# Patient Record
Sex: Female | Born: 1955 | Race: Black or African American | Hispanic: No | Marital: Married | Smoking: Former smoker
Health system: Southern US, Community
[De-identification: ages and names within clinical notes are randomized; demographics above are authoritative.]

## PROBLEM LIST (undated history)

## (undated) DIAGNOSIS — M542 Cervicalgia: Secondary | ICD-10-CM

## (undated) DIAGNOSIS — M25519 Pain in unspecified shoulder: Secondary | ICD-10-CM

## (undated) DIAGNOSIS — R87629 Unspecified abnormal cytological findings in specimens from vagina: Secondary | ICD-10-CM

## (undated) HISTORY — DX: Pain in unspecified shoulder: M25.519

## (undated) HISTORY — DX: Cervicalgia: M54.2

## (undated) HISTORY — DX: Unspecified abnormal cytological findings in specimens from vagina: R87.629

---

## 2011-03-09 ENCOUNTER — Emergency Department (HOSPITAL_BASED_OUTPATIENT_CLINIC_OR_DEPARTMENT_OTHER)
Admission: EM | Admit: 2011-03-09 | Discharge: 2011-03-09 | Disposition: A | Discharge status: Home / Self Care | Payer: Managed Care, Other (non HMO) | Attending: Emergency Medicine | Admitting: Emergency Medicine

## 2011-03-09 DIAGNOSIS — K5900 Constipation, unspecified: Secondary | ICD-10-CM | POA: Insufficient documentation

## 2011-03-09 DIAGNOSIS — K589 Irritable bowel syndrome without diarrhea: Secondary | ICD-10-CM | POA: Insufficient documentation

## 2013-10-18 LAB — HM COLONOSCOPY

## 2016-04-27 ENCOUNTER — Encounter (HOSPITAL_BASED_OUTPATIENT_CLINIC_OR_DEPARTMENT_OTHER): Payer: Self-pay | Admitting: *Deleted

## 2016-04-27 ENCOUNTER — Emergency Department (HOSPITAL_BASED_OUTPATIENT_CLINIC_OR_DEPARTMENT_OTHER)
Admission: EM | Admit: 2016-04-27 | Discharge: 2016-04-27 | Disposition: A | Discharge status: Home / Self Care | Payer: 59 | Attending: Emergency Medicine | Admitting: Emergency Medicine

## 2016-04-27 DIAGNOSIS — Y999 Unspecified external cause status: Secondary | ICD-10-CM | POA: Diagnosis not present

## 2016-04-27 DIAGNOSIS — Y929 Unspecified place or not applicable: Secondary | ICD-10-CM | POA: Diagnosis not present

## 2016-04-27 DIAGNOSIS — S61216A Laceration without foreign body of right little finger without damage to nail, initial encounter: Secondary | ICD-10-CM | POA: Diagnosis not present

## 2016-04-27 DIAGNOSIS — W268XXA Contact with other sharp object(s), not elsewhere classified, initial encounter: Secondary | ICD-10-CM | POA: Diagnosis not present

## 2016-04-27 DIAGNOSIS — IMO0002 Reserved for concepts with insufficient information to code with codable children: Secondary | ICD-10-CM

## 2016-04-27 DIAGNOSIS — S6991XA Unspecified injury of right wrist, hand and finger(s), initial encounter: Secondary | ICD-10-CM | POA: Diagnosis present

## 2016-04-27 DIAGNOSIS — Y9389 Activity, other specified: Secondary | ICD-10-CM | POA: Diagnosis not present

## 2016-04-27 NOTE — ED Provider Notes (Signed)
CSN: [id number]     Arrival date & time 04/27/16  0940 History   First MD Initiated Contact with Patient 04/27/16 1003     Chief Complaint  Patient presents with  . Finger Injury     (Consider location/radiation/quality/duration/timing/severity/associated sxs/prior Treatment) Patient is a 60 y.o. female presenting with skin laceration.  Laceration Location:  Finger Finger laceration location:  R little finger Length (cm):  2 Depth:  Cutaneous Quality: jagged   Bleeding: controlled   Time since incident:  14 hours Laceration mechanism:  Metal edge Pain details:    Severity:  No pain Foreign body present:  No foreign bodies Relieved by:  None tried Worsened by:  Nothing tried Ineffective treatments:  None tried Tetanus status:  Up to date   History reviewed. No pertinent past medical history. Past Surgical History  Procedure Laterality Date  . Cesarean section     No family history on file. Social History  Substance Use Topics  . Smoking status: Never Smoker   . Smokeless tobacco: Never Used  . Alcohol Use: Yes     Comment: red wine   OB History    No data available     Review of Systems  Skin: Positive for wound.  All other systems reviewed and are negative.     Allergies  Review of patient's allergies indicates no known allergies.  Home Medications   Prior to Admission medications   Not on File   BP 148/76 mmHg  Pulse 64  Temp(Src) 98.7 F (37.1 C) (Oral)  Resp 18  Ht 5\' 4"  (1.626 m)  Wt 145 lb (65.772 kg)  BMI 24.88 kg/m2  SpO2 100% Physical Exam  Constitutional: She appears well-developed and well-nourished.  HENT:  Head: Normocephalic and atraumatic.  Neck: Normal range of motion.  Cardiovascular: Normal rate and regular rhythm.   Pulmonary/Chest: No stridor. No respiratory distress.  Abdominal: She exhibits no distension.  Neurological: She is alert.  Skin:  2 cm jagged lacto medial right small finger. NVI. Normal strength with dorsi  flexion and plantar flexion of finger.  Nursing note and vitals reviewed.   ED Course  .[name].Laceration Repair Date/Time: 04/27/2016 10:35 AM Performed by: Marily MemosMESNER, Jenne Sellinger Authorized by: Marily MemosMESNER, Aum Caggiano Consent: Verbal consent obtained. Risks and benefits: risks, benefits and alternatives were discussed Consent given by: patient Body area: upper extremity Location details: right small finger Laceration length: 2 cm Tendon involvement: none Nerve involvement: none Vascular damage: no Patient sedated: no Irrigation solution: saline Debridement: none Degree of undermining: none Skin closure: glue Technique: simple Approximation: close Approximation difficulty: simple Dressing: splint and tube gauze Patient tolerance: Patient tolerated the procedure well with no immediate complications   (including critical care time) Labs Review Labs Reviewed - No data to display  Imaging Review No results found. I have personally reviewed and evaluated these images and lab results as part of my medical decision-making.   EKG Interpretation None      MDM   Final diagnoses:  Laceration    60 yo F w/ laceration as described above. Low suspicion of foreign body or tendon injury. Closed with dermabond and splinted. Has had tdap in last 5 years. Will take off splint in a couple days, return here for any worsening symptoms.   New Prescriptions: New Prescriptions   No medications on file     I have personally and contemperaneously reviewed labs and imaging and used in my decision making as above.   A medical screening exam was performed  and I feel the patient has had an appropriate workup for their chief complaint at this time and likelihood of emergent condition existing is low and thus workup can continue on an outpatient basis.. Their vital signs are stable. They have been counseled on decision, discharge, follow up and which symptoms necessitate immediate return to the emergency department.   They verbally stated understanding and agreement with plan and discharged in stable condition.      Marily Memos, MD 04/27/16 1038

## 2016-04-27 NOTE — ED Notes (Signed)
Pt reports she cut her right pinky on a metal lid yesterday evening. States her tetanus is up to date per her PCP

## 2018-03-29 ENCOUNTER — Encounter: Payer: Self-pay | Admitting: Physician Assistant

## 2018-05-29 ENCOUNTER — Emergency Department
Admission: EM | Admit: 2018-05-29 | Discharge: 2018-05-29 | Disposition: A | Discharge status: Home / Self Care | Payer: 59 | Source: Home / Self Care | Attending: Family Medicine | Admitting: Family Medicine

## 2018-05-29 ENCOUNTER — Encounter: Payer: Self-pay | Admitting: *Deleted

## 2018-05-29 ENCOUNTER — Other Ambulatory Visit: Payer: Self-pay

## 2018-05-29 DIAGNOSIS — M25562 Pain in left knee: Secondary | ICD-10-CM | POA: Diagnosis not present

## 2018-05-29 NOTE — ED Provider Notes (Signed)
[facility]    CSN: [id number] Arrival date & time: 05/29/18  4098     History   Chief Complaint Chief Complaint  Patient presents with  . Knee Pain    HPI Casey Castaneda is a 62 y.o. female.   Patient reports that she exercises regularly by walking and using a stair stepper.  About a month ago she began to develop vague pain in her posterior knee that radiate to her superior posterior left calf.  She recalls no injury or change in physical activities.  The pain would tend to occur when initiating walking and stair stepping, resolving spontaneously.  The pain seemed to improve somewhat after beginning hamstring stretches during the past 3 weeks.  During the past 3 to 4 days her pain has recurred after lifting her 30 pound grandchild over the weekend.  The history is provided by the patient.  Knee Pain  Location:  Knee Time since incident:  1 month Injury: no   Knee location:  L knee Pain details:    Quality:  Aching   Radiates to: superior posterior calf.   Severity:  Mild   Onset quality:  Gradual   Duration:  1 month   Timing:  Sporadic   Progression:  Unchanged Chronicity:  Recurrent Prior injury to area:  No Relieved by: activity. Ineffective treatments: stretches. Associated symptoms: no decreased ROM, no fatigue, no muscle weakness, no numbness, no stiffness and no swelling     History reviewed. No pertinent past medical history.  There are no active problems to display for this patient.   Past Surgical History:  Procedure Laterality Date  . CESAREAN SECTION      OB History   None      Home Medications    Prior to Admission medications   Not on File    Family History Family History  Problem Relation Age of Onset  . Leukemia Mother   . Hypertension Father     Social History Social History   Tobacco Use  . Smoking status: Former Games developer  . Smokeless tobacco: Never Used  Substance Use Topics  . Alcohol use: Yes    Comment: red  wine 4 q wk  . Drug use: No     Allergies   Patient has no known allergies.   Review of Systems Review of Systems  Constitutional: Negative for fatigue.  Musculoskeletal: Negative for stiffness.  All other systems reviewed and are negative.    Physical Exam Triage Vital Signs ED Triage Vitals  Enc Vitals Group     BP 05/29/18 1032 (!) 155/81     Pulse Rate 05/29/18 1032 (!) 58     Resp 05/29/18 1032 16     Temp 05/29/18 1032 98.1 F (36.7 C)     Temp Source 05/29/18 1032 Oral     SpO2 05/29/18 1032 100 %     Weight 05/29/18 1033 143 lb (64.9 kg)     Height 05/29/18 1033 5\' 4"  (1.626 m)     Head Circumference --      Peak Flow --      Pain Score 05/29/18 1032 4     Pain Loc --      Pain Edu? --      Excl. in GC? --    No data found.  Updated Vital Signs BP (!) 155/81 (BP Location: Right Arm)   Pulse (!) 58   Temp 98.1 F (36.7 C) (Oral)   Resp 16   Ht  5\' 4"  (1.626 m)   Wt 143 lb (64.9 kg)   SpO2 100%   BMI 24.55 kg/m   Visual Acuity Right Eye Distance:   Left Eye Distance:   Bilateral Distance:    Right Eye Near:   Left Eye Near:    Bilateral Near:     Physical Exam  Constitutional: She appears well-developed and well-nourished. No distress.  HENT:  Head: Normocephalic.  Eyes: Pupils are equal, round, and reactive to light.  Cardiovascular: Normal rate.  Pulmonary/Chest: Effort normal.  Musculoskeletal: She exhibits no edema.       Left knee: She exhibits normal range of motion, no swelling, no effusion, no ecchymosis, no deformity, no erythema, normal alignment, no LCL laxity, normal patellar mobility, no bony tenderness and normal meniscus. No medial joint line, no lateral joint line, no MCL, no LCL and no patellar tendon tenderness noted.   Left knee:  No effusion, erythema, or warmth.  Knee stable, negative drawer test.  McMurray test negative.  No medial or lateral joint line or ligament tenderness.  There is vague tenderness posteriorly over  insertion of flexor tendons. No lower leg tenderness in calf or anteriorly.  Neurological: She is alert.  Skin: Skin is warm and dry.  Nursing note and vitals reviewed.    UC Treatments / Results  Labs (all labs ordered are listed, but only abnormal results are displayed) Labs Reviewed - No data to display  EKG None  Radiology No results found.  Procedures Procedures (including critical care time)  Medications Ordered in UC Medications - No data to display  Initial Impression / Assessment and Plan / UC Course  I have reviewed the triage vital signs and the nursing notes.  Pertinent labs & imaging results that were available during my care of the patient were reviewed by me and considered in my medical decision making (see chart for details).    Source of patient's posterior knee pain not entirely clear.  Suspect tendinopathy at insertion of flexor tendons posterior left knee.  Note that she has been performing only hamstring stretches after her exercise routines.  Recommend that she begin trial of quad stretches. Followup with Sports Medicine for further evaluation.   Final Clinical Impressions(s) / UC Diagnoses   Final diagnoses:  Acute pain of left knee     Discharge Instructions     Try quad stretches.  May take Ibuprofen 200mg , 4 tabs every 8 hours with food.    ED Prescriptions    None         Lattie HawBeese, Stephen A, MD 05/29/18 1257

## 2018-05-29 NOTE — Discharge Instructions (Addendum)
Try quad stretches.  May take Ibuprofen 200mg , 4 tabs every 8 hours with food.

## 2018-05-29 NOTE — ED Triage Notes (Signed)
Pt c/o behind her LT knee radiating down her LT calf 1 mth ago and the pani returned again yesterday.Also, notes some numbness in her LT great toe yesterday.

## 2018-05-30 ENCOUNTER — Telehealth: Payer: Self-pay | Admitting: Physician Assistant

## 2018-05-30 NOTE — Telephone Encounter (Signed)
Pt is the wife of Alysia Pennarvin, who does our [facility] medical records request. She is looking for new PCP. She was seen in UC, who contacted to see if Caleen EssexBreeback would take Pt. Routing.

## 2018-05-30 NOTE — Telephone Encounter (Signed)
Spoke with patient- states she has a lot of appointments coming up ad a lot going on- she will call back to schedule at another time.

## 2018-05-30 NOTE — Telephone Encounter (Signed)
Can we get her scheduled for a new Pt, establish care? Thank you!

## 2018-05-30 NOTE — Telephone Encounter (Signed)
Sure

## 2018-06-04 ENCOUNTER — Encounter: Payer: Self-pay | Admitting: Sports Medicine

## 2018-06-04 ENCOUNTER — Ambulatory Visit (INDEPENDENT_AMBULATORY_CARE_PROVIDER_SITE_OTHER): Payer: 59

## 2018-06-04 ENCOUNTER — Ambulatory Visit (INDEPENDENT_AMBULATORY_CARE_PROVIDER_SITE_OTHER): Payer: 59 | Admitting: Sports Medicine

## 2018-06-04 DIAGNOSIS — M1712 Unilateral primary osteoarthritis, left knee: Secondary | ICD-10-CM

## 2018-06-04 DIAGNOSIS — M25562 Pain in left knee: Secondary | ICD-10-CM | POA: Diagnosis not present

## 2018-06-04 DIAGNOSIS — M25561 Pain in right knee: Secondary | ICD-10-CM

## 2018-06-04 DIAGNOSIS — M170 Bilateral primary osteoarthritis of knee: Secondary | ICD-10-CM | POA: Diagnosis not present

## 2018-06-04 MED ORDER — CELECOXIB 200 MG PO CAPS
ORAL_CAPSULE | ORAL | 2 refills | Status: DC
Start: 2018-06-04 — End: 2018-07-13

## 2018-06-04 MED ORDER — DICLOFENAC SODIUM 2 % TD SOLN: 2.0000 | Freq: Two times a day (BID) | TRANSDERMAL | 11 refills | Status: DC

## 2018-06-04 NOTE — Assessment & Plan Note (Signed)
Mild, starting conservatively. X-rays, Celebrex, topical Pennsaid (diclofenac 2% topical). Rehab exercises given.  Return to see me in 6 weeks, injection if no better.

## 2018-06-04 NOTE — Progress Notes (Signed)
Subjective:    I'm seeing this patient as a consultation for: Dr. Donna ChristenStephen Beese  CC: Left knee pain  HPI: For months this pleasant 62 year old female has had pain that she localizes the posterior medial joint line, moderate, persistent without radiation, mechanical symptoms.  Significant gelling, really has not done any oral analgesics or anti-inflammatories.  No trauma.  I reviewed the past medical history, family history, social history, surgical history, and allergies today and no changes were needed.  Please see the problem list section below in epic for further details.  Past Medical History: No past medical history on file. Past Surgical History: Past Surgical History:  Procedure Laterality Date  . CESAREAN SECTION     Social History: Social History   Socioeconomic History  . Marital status: Married    Spouse name: Not on file  . Number of children: Not on file  . Years of education: Not on file  . Highest education level: Not on file  Occupational History  . Not on file  Social Needs  . Financial resource strain: Not on file  . Food insecurity:    Worry: Not on file    Inability: Not on file  . Transportation needs:    Medical: Not on file    Non-medical: Not on file  Tobacco Use  . Smoking status: Former Games developermoker  . Smokeless tobacco: Never Used  Substance and Sexual Activity  . Alcohol use: Yes    Comment: red wine 4 q wk  . Drug use: No  . Sexual activity: Not on file  Lifestyle  . Physical activity:    Days per week: Not on file    Minutes per session: Not on file  . Stress: Not on file  Relationships  . Social connections:    Talks on phone: Not on file    Gets together: Not on file    Attends religious service: Not on file    Active member of club or organization: Not on file    Attends meetings of clubs or organizations: Not on file    Relationship status: Not on file  Other Topics Concern  . Not on file  Social History Narrative  . Not on  file   Family History: Family History  Problem Relation Age of Onset  . Leukemia Mother   . Hypertension Father    Allergies: No Known Allergies Medications: See med rec.  Review of Systems: No headache, visual changes, nausea, vomiting, diarrhea, constipation, dizziness, abdominal pain, skin rash, fevers, chills, night sweats, weight loss, swollen lymph nodes, body aches, joint swelling, muscle aches, chest pain, shortness of breath, mood changes, visual or auditory hallucinations.   Objective:   General: Well Developed, well nourished, and in no acute distress.  Neuro:  Extra-ocular muscles intact, able to move all 4 extremities, sensation grossly intact.  Deep tendon reflexes tested were normal. Psych: Alert and oriented, mood congruent with affect. ENT:  Ears and nose appear unremarkable.  Hearing grossly normal. Neck: Unremarkable overall appearance, trachea midline.  No visible thyroid enlargement. Eyes: Conjunctivae and lids appear unremarkable.  Pupils equal and round. Skin: Warm and dry, no rashes noted.  Cardiovascular: Pulses palpable, no extremity edema. Left knee: Normal to inspection with no erythema or effusion or obvious bony abnormalities. Tender to palpation at the posterior/medial joint line ROM normal in flexion and extension and lower leg rotation. Ligaments with solid consistent endpoints including ACL, PCL, LCL, MCL. Negative Mcmurray's and provocative meniscal tests. Non painful patellar  compression. Patellar and quadriceps tendons unremarkable. Hamstring and quadriceps strength is normal.  Impression and Recommendations:   This case required medical decision making of moderate complexity.  Primary osteoarthritis of left knee Mild, starting conservatively. X-rays, Celebrex, topical Pennsaid (diclofenac 2% topical). Rehab exercises given.  Return to see me in 6 weeks, injection if no better.  ___________________________________________ Ihor Austin.  Benjamin Stain, M.D., ABFM., CAQSM. Primary Care and Sports Medicine [facility]  Adjunct Instructor of Family Medicine  [employer]

## 2018-07-10 ENCOUNTER — Encounter: Payer: Self-pay | Admitting: Physician Assistant

## 2018-07-13 ENCOUNTER — Encounter: Payer: Self-pay | Admitting: Physician Assistant

## 2018-07-13 ENCOUNTER — Ambulatory Visit (INDEPENDENT_AMBULATORY_CARE_PROVIDER_SITE_OTHER): Payer: 59 | Admitting: Physician Assistant

## 2018-07-13 VITALS — BP 115/74 | HR 61 | Ht 64.0 in | Wt 144.0 lb

## 2018-07-13 DIAGNOSIS — M542 Cervicalgia: Secondary | ICD-10-CM | POA: Diagnosis not present

## 2018-07-13 DIAGNOSIS — E875 Hyperkalemia: Secondary | ICD-10-CM | POA: Insufficient documentation

## 2018-07-13 DIAGNOSIS — M6289 Other specified disorders of muscle: Secondary | ICD-10-CM | POA: Insufficient documentation

## 2018-07-13 DIAGNOSIS — D72819 Decreased white blood cell count, unspecified: Secondary | ICD-10-CM | POA: Insufficient documentation

## 2018-07-13 NOTE — Progress Notes (Signed)
Subjective:    Patient ID: Casey Castaneda, female    DOB: 1955/12/13, 62 y.o.   MRN: [id number]  HPI  Pt is a 62 yo female who presents to the clinic to establish care as a new patient.   She is not on any regular medications.  She was seen recently in the urgent care for knee pain which was diagnosed as arthritis.  She has done her stretches and use the diclofenac spray and it has resolved.  She is having some upper neck, back tightness and pain.  She denies any numbness or tingling going down either arm.  She has not tried anything to make better.  She is on the computer a lot at work.   She does have a history of low white blood count.  Unaware of what is causing this.  Her most recent labs were checked 07/10/2018 and white blood count was 3.3 almost in normal range.  She denies any fever, chills, night sweats.  She is always concerned because her mother passed away from leukemia.  Also in her labs there was an elevated serum potassium of 5.4.  She denies any muscle cramps, pain, chest pain, weakness, diarrhea or any other symptoms.  She does not take any medication daily.  .. Active Ambulatory Problems    Diagnosis Date Noted  . Primary osteoarthritis of left knee 06/04/2018  . Serum potassium elevated 07/13/2018  . Leukopenia 07/13/2018  . Muscle tightness 07/13/2018  . Neck pain 07/13/2018   Resolved Ambulatory Problems    Diagnosis Date Noted  . No Resolved Ambulatory Problems   Past Medical History:  Diagnosis Date  . Abnormal Pap smear of vagina   . Neck pain   . Shoulder pain    .[name] Family History  Problem Relation Age of Onset  . Leukemia Mother   . Hypertension Father    .[name] Social History   Socioeconomic History  . Marital status: Married    Spouse name: Not on file  . Number of children: Not on file  . Years of education: Not on file  . Highest education level: Not on file  Occupational History  . Not on file  Social Needs  . Financial resource strain:  Not on file  . Food insecurity:    Worry: Not on file    Inability: Not on file  . Transportation needs:    Medical: Not on file    Non-medical: Not on file  Tobacco Use  . Smoking status: Former Games developer  . Smokeless tobacco: Never Used  Substance and Sexual Activity  . Alcohol use: Yes    Comment: red wine 4 q wk  . Drug use: No  . Sexual activity: Not on file  Lifestyle  . Physical activity:    Days per week: Not on file    Minutes per session: Not on file  . Stress: Not on file  Relationships  . Social connections:    Talks on phone: Not on file    Gets together: Not on file    Attends religious service: Not on file    Active member of club or organization: Not on file    Attends meetings of clubs or organizations: Not on file    Relationship status: Not on file  . Intimate partner violence:    Fear of current or ex partner: Not on file    Emotionally abused: Not on file    Physically abused: Not on file    Forced sexual  activity: Not on file  Other Topics Concern  . Not on file  Social History Narrative  . Not on file      Review of Systems  All other systems reviewed and are negative.      Objective:   Physical Exam  Constitutional: She is oriented to person, place, and time. She appears well-developed and well-nourished.  HENT:  Head: Normocephalic and atraumatic.  Right Ear: External ear normal.  Left Ear: External ear normal.  Nose: Nose normal.  Mouth/Throat: Oropharynx is clear and moist.  Bilateral cerumen impaction.   Eyes: Pupils are equal, round, and reactive to light. Conjunctivae and EOM are normal.  Neck: Normal range of motion. Neck supple.  Cardiovascular: Normal rate, regular rhythm and normal heart sounds.  No murmur heard. Pulmonary/Chest: Effort normal and breath sounds normal.  Abdominal: Soft. Bowel sounds are normal. She exhibits no distension and no mass. There is no tenderness.  Musculoskeletal:  Tightness over upper shoulders,  neck.  No pain to palpation over cervical spine.  NROM of neck.  Upper extremity 5/5 strength.  NROM of shoulders.   Neurological: She is alert and oriented to person, place, and time.  Psychiatric: She has a normal mood and affect. Her behavior is normal.          Assessment & Plan:  [name].[name].Diagnoses and all orders for this visit:  Neck pain  Serum potassium elevated -     Basic metabolic panel  Muscle tightness  Leukopenia, unspecified type   .[name]. Depression screen PHQ 2/9 07/13/2018  Decreased Interest 0  Down, Depressed, Hopeless 0  PHQ - 2 Score 0   .. GAD 7 : Generalized Anxiety Score 07/13/2018  Nervous, Anxious, on Edge 0  Control/stop worrying 0  Worry too much - different things 0  Trouble relaxing 0  Restless 0  Easily annoyed or irritable 0  Afraid - awful might happen 0  Total GAD 7 Score 0    Patient has had recent labs.  They were very reassuring.  We will follow the elevated potassium with a repeat BMP in the next month.  Her white blood count is just barely low.  Her hemoglobin, red blood cells, platelets all look fabulous.  Reassured her this is not like the clinical picture of leukemia.  She also does not have any easy bruising, bleeding, fatigue.  I do think her neck pain and muscle tightness is likely coming from posture.  Encouraged her to start biweekly or monthly massages, encouraged heat, encouraged Biofreeze.  If symptoms persist we could consider imaging and/or physical therapy.  Discussed muscle relaxer and anti-inflammatories.  She declined any prescriptions today.  She could start natural turmeric for inflammation.  Elevated potassium. Will recheck with BMP.   WBC low. Reassurance just hairline will continue to monitor.   Discussed shingles. Pt declined.

## 2018-07-13 NOTE — Patient Instructions (Signed)
Massages and heat. Work on posture at work.   Neck Exercises Neck exercises can be important for many reasons:  They can help you to improve and maintain flexibility in your neck. This can be especially important as you age.  They can help to make your neck stronger. This can make movement easier.  They can reduce or prevent neck pain.  They may help your upper back.  Ask your health care provider which neck exercises would be best for you. Exercises Neck Press Repeat this exercise 10 times. Do it first thing in the morning and right before bed or as told by your health care provider. 1. Lie on your back on a firm bed or on the floor with a pillow under your head. 2. Use your neck muscles to push your head down on the pillow and straighten your spine. 3. Hold the position as well as you can. Keep your head facing up and your chin tucked. 4. Slowly count to 5 while holding this position. 5. Relax for a few seconds. Then repeat.  Isometric Strengthening Do a full set of these exercises 2 times a day or as told by your health care provider. 1. Sit in a supportive chair and place your hand on your forehead. 2. Push forward with your head and neck while pushing back with your hand. Hold for 10 seconds. 3. Relax. Then repeat the exercise 3 times. 4. Next, do thesequence again, this time putting your hand against the back of your head. Use your head and neck to push backward against the hand pressure. 5. Finally, do the same exercise on either side of your head, pushing sideways against the pressure of your hand.  Prone Head Lifts Repeat this exercise 5 times. Do this 2 times a day or as told by your health care provider. 1. Lie face-down, resting on your elbows so that your chest and upper back are raised. 2. Start with your head facing downward, near your chest. Position your chin either on or near your chest. 3. Slowly lift your head upward. Lift until you are looking straight ahead.  Then continue lifting your head as far back as you can stretch. 4. Hold your head up for 5 seconds. Then slowly lower it to your starting position.  Supine Head Lifts Repeat this exercise 8-10 times. Do this 2 times a day or as told by your health care provider. 1. Lie on your back, bending your knees to point to the ceiling and keeping your feet flat on the floor. 2. Lift your head slowly off the floor, raising your chin toward your chest. 3. Hold for 5 seconds. 4. Relax and repeat.  Scapular Retraction Repeat this exercise 5 times. Do this 2 times a day or as told by your health care provider. 1. Stand with your arms at your sides. Look straight ahead. 2. Slowly pull both shoulders backward and downward until you feel a stretch between your shoulder blades in your upper back. 3. Hold for 10-30 seconds. 4. Relax and repeat.  Contact a health care provider if:  Your neck pain or discomfort gets much worse when you do an exercise.  Your neck pain or discomfort does not improve within 2 hours after you exercise. If you have any of these problems, stop exercising right away. Do not do the exercises again unless your health care provider says that you can. Get help right away if:  You develop sudden, severe neck pain. If this happens, stop exercising  right away. Do not do the exercises again unless your health care provider says that you can. Exercises Neck Stretch  Repeat this exercise 3-5 times. 1. Do this exercise while standing or while sitting in a chair. 2. Place your feet flat on the floor, shoulder-width apart. 3. Slowly turn your head to the right. Turn it all the way to the right so you can look over your right shoulder. Do not tilt or tip your head. 4. Hold this position for 10-30 seconds. 5. Slowly turn your head to the left, to look over your left shoulder. 6. Hold this position for 10-30 seconds.  Neck Retraction Repeat this exercise 8-10 times. Do this 3-4 times a day  or as told by your health care provider. 1. Do this exercise while standing or while sitting in a sturdy chair. 2. Look straight ahead. Do not bend your neck. 3. Use your fingers to push your chin backward. Do not bend your neck for this movement. Continue to face straight ahead. If you are doing the exercise properly, you will feel a slight sensation in your throat and a stretch at the back of your neck. 4. Hold the stretch for 1-2 seconds. Relax and repeat.  This information is not intended to replace advice given to you by your health care provider. Make sure you discuss any questions you have with your health care provider. Document Released: 11/09/2015 Document Revised: 05/05/2016 Document Reviewed: 06/08/2015 Elsevier Interactive Patient Education  2018 [employer] Thoracic Strain Thoracic strain is an injury to the muscles or tendons that attach to the upper back. A strain can be mild or severe. A mild strain may take only 1-2 weeks to heal. A severe strain involves torn muscles or tendons, so it may take 6-8 weeks to heal. Follow these instructions at home:  Rest as needed. Limit your activity as told by your doctor.  If directed, put ice on the injured area: ? Put ice in a plastic bag. ? Place a towel between your skin and the bag. ? Leave the ice on for 20 minutes, 2-3 times per day.  Take over-the-counter and prescription medicines only as told by your doctor.  Begin doing exercises as told by your doctor or physical therapist.  Warm up before being active.  Bend your knees before you lift heavy objects.  Keep all follow-up visits as told by your doctor. This is important. Contact a doctor if:  Your pain is not helped by medicine.  Your pain, bruising, or swelling is getting worse.  You have a fever. Get help right away if:  You have shortness of breath.  You have chest pain.  You have weakness or loss of feeling (numbness) in your legs.  You cannot control  when you pee (urinate). This information is not intended to replace advice given to you by your health care provider. Make sure you discuss any questions you have with your health care provider. Document Released: 05/16/2008 Document Revised: 07/30/2016 Document Reviewed: 01/22/2015 Elsevier Interactive Patient Education  [employer].

## 2018-07-16 ENCOUNTER — Ambulatory Visit: Payer: Managed Care, Other (non HMO) | Admitting: Sports Medicine

## 2018-08-06 ENCOUNTER — Ambulatory Visit (INDEPENDENT_AMBULATORY_CARE_PROVIDER_SITE_OTHER): Payer: 59 | Admitting: Physician Assistant

## 2018-08-06 ENCOUNTER — Encounter: Payer: Self-pay | Admitting: Physician Assistant

## 2018-08-06 VITALS — BP 138/64 | HR 63 | Temp 98.7°F | Ht 64.0 in | Wt 145.0 lb

## 2018-08-06 DIAGNOSIS — J329 Chronic sinusitis, unspecified: Secondary | ICD-10-CM

## 2018-08-06 DIAGNOSIS — J40 Bronchitis, not specified as acute or chronic: Secondary | ICD-10-CM | POA: Diagnosis not present

## 2018-08-06 DIAGNOSIS — B309 Viral conjunctivitis, unspecified: Secondary | ICD-10-CM | POA: Diagnosis not present

## 2018-08-06 MED ORDER — POLYMYXIN B-TRIMETHOPRIM 10000-0.1 UNIT/ML-% OP SOLN: 1.0000 [drp] | OPHTHALMIC | 0 refills | Status: DC

## 2018-08-06 MED ORDER — AZITHROMYCIN 250 MG PO TABS: ORAL_TABLET | ORAL | 0 refills | Status: DC

## 2018-08-06 NOTE — Progress Notes (Signed)
   Subjective:    Patient ID: Casey Castaneda, female    DOB: 07/10/1956, 62 y.o.   MRN: [id number]  HPI  Pt is a 62 yo female who presents to the clinic with red itchy right eye and 1 week of upper respiratory symptoms. She denies any fever. She has had productive cough with yellow sputum, sinus pressure, ear pressure and yesterday right eye irritation and watery/crusty discharge. Denies any vision changes or eye pain. Taken OTC mucinex and tylenol cold and sinus without relief. She did have a sick grandbaby come visit a little over a week ago.   .. Active Ambulatory Problems    Diagnosis Date Noted  . Primary osteoarthritis of left knee 06/04/2018  . Serum potassium elevated 07/13/2018  . Leukopenia 07/13/2018  . Muscle tightness 07/13/2018  . Neck pain 07/13/2018   Resolved Ambulatory Problems    Diagnosis Date Noted  . No Resolved Ambulatory Problems   Past Medical History:  Diagnosis Date  . Abnormal Pap smear of vagina   . Shoulder pain       Review of Systems See HPI.     Objective:   Physical Exam  Constitutional: She is oriented to person, place, and time. She appears well-developed and well-nourished.  HENT:  Head: Normocephalic and atraumatic.  Right Ear: External ear normal.  Left Ear: External ear normal.  Nose: Nose normal.  Mouth/Throat: Oropharynx is clear and moist. No oropharyngeal exudate.  Eyes: EOM are normal.  Right injected conjunctiva with watery discharge.   Neck: Neck supple.  Non tender.   Cardiovascular: Normal rate and regular rhythm.  Pulmonary/Chest: Effort normal and breath sounds normal. She has no wheezes.  Lymphadenopathy:    She has cervical adenopathy.  Neurological: She is alert and oriented to person, place, and time.  Psychiatric: She has a normal mood and affect. Her behavior is normal.          Assessment & Plan:  [name].[name].Diagnoses and all orders for this visit:  Sinobronchitis -     azithromycin (ZITHROMAX) 250 MG tablet;  Take 2 tablets today and then one tablet for 4 days.  Viral conjunctivitis, right eye -     trimethoprim-polymyxin b (POLYTRIM) ophthalmic solution; Place 1 drop into the right eye every 4 (four) hours. For 7 days.   Suspect viral conjunctivitis due to no pain or purulent discharge. Due to one week timeline of symptoms will treat for bacterial infection starting. Start zpak with conservative treatment for eye. If not improving can add polytrim for 7 days. Discussed prevention of spread. Written out of work for today.

## 2018-08-06 NOTE — Patient Instructions (Signed)
Viral Conjunctivitis, Adult Viral conjunctivitis is an inflammation of the clear membrane that covers the white part of your eye and the inner surface of your eyelid (conjunctiva). The inflammation is caused by a viral infection. The blood vessels in the conjunctiva become inflamed, causing the eye to become red or pink, and often itchy. Viral conjunctivitis can be easily passed from one person to another (is contagious). This condition is often called pink eye. What are the causes? This condition is caused by a virus. A virus is a type of contagious germ. It can be spread by touching objects that have been contaminated with the virus, such as doorknobs or towels. It can also be passed through droplets, such as from coughing or sneezing. What are the signs or symptoms? Symptoms of this condition include:  Eye redness.  Tearing or watery eyes.  Itchy and irritated eyes.  Burning feeling in the eyes.  Clear drainage from the eye.  Swollen eyelids.  A gritty feeling in the eye.  Light sensitivity.  This condition often occurs with other symptoms, such as a fever, nausea, or a rash. How is this diagnosed? This condition is diagnosed with a medical history and physical exam. If you have discharge from your eye, the discharge may be tested to rule out other causes of conjunctivitis. How is this treated? Viral conjunctivitis does not respond to medicines that kill bacteria (antibiotics). Treatment for viral conjunctivitis is directed at stopping a bacterial infection from developing in addition to the viral infection. Treatment also aims to relieve your symptoms, such as itching. This may be done with antihistamine drops or other eye medicines. Rarely, steroid eye drops or antiviral medicines may be prescribed. Follow these instructions at home: Medicines   Take or apply over-the-counter and prescription medicines only as told by your health care provider.  Be very careful to avoid  touching the edge of the eyelid with the eye drop bottle or ointment tube when applying medicines to the affected eye. Being careful this way will stop you from spreading the infection to the other eye or to other people. Eye care  Avoid touching or rubbing your eyes.  Apply a warm, wet, clean washcloth to your eye for 10-20 minutes, 3-4 times per day or as told by your health care provider.  If you wear contact lenses, do not wear them until the inflammation is gone and your health care provider says it is safe to wear them again. Ask your health care provider how to sterilize or replace your contact lenses before using them again. Wear glasses until you can resume wearing contacts.  Avoid wearing eye makeup until the inflammation is gone. Throw away any old eye cosmetics that may be contaminated.  Gently wipe away any drainage from your eye with a warm, wet washcloth or a cotton ball. General instructions  Change or wash your pillowcase every day or as told by your health care provider.  Do not share towels, pillowcases, washcloths, eye makeup, makeup brushes, contact lenses, or glasses. This may spread the infection.  Wash your hands often with soap and water. Use paper towels to dry your hands. If soap and water are not available, use hand sanitizer.  Try to avoid contact with other people for one week or as told by your health care provider. Contact a health care provider if:  Your symptoms do not improve with treatment or they get worse.  You have increased pain.  Your vision becomes blurry.  You have a   fever.  You have facial pain, redness, or swelling.  You have yellow or green drainage coming from your eye.  You have new symptoms. This information is not intended to replace advice given to you by your health care provider. Make sure you discuss any questions you have with your health care provider. Document Released: 02/18/2003 Document Revised: 06/25/2016 Document  Reviewed: 06/14/2016 Elsevier Interactive Patient Education  2018 Elsevier Inc.  

## 2018-10-04 DIAGNOSIS — Z1231 Encounter for screening mammogram for malignant neoplasm of breast: Secondary | ICD-10-CM | POA: Diagnosis not present

## 2018-10-12 LAB — HM MAMMOGRAPHY

## 2018-10-31 ENCOUNTER — Encounter: Payer: Self-pay | Admitting: Physician Assistant

## 2018-10-31 ENCOUNTER — Ambulatory Visit (INDEPENDENT_AMBULATORY_CARE_PROVIDER_SITE_OTHER): Payer: 59 | Admitting: Physician Assistant

## 2018-10-31 ENCOUNTER — Other Ambulatory Visit (HOSPITAL_COMMUNITY)
Admission: RE | Admit: 2018-10-31 | Discharge: 2018-10-31 | Disposition: A | Discharge status: Home / Self Care | Payer: 59 | Source: Ambulatory Visit | Attending: Physician Assistant | Admitting: Physician Assistant

## 2018-10-31 ENCOUNTER — Telehealth: Payer: Self-pay | Admitting: Physician Assistant

## 2018-10-31 VITALS — BP 149/63 | HR 60 | Ht 64.0 in | Wt 143.0 lb

## 2018-10-31 DIAGNOSIS — H6123 Impacted cerumen, bilateral: Secondary | ICD-10-CM

## 2018-10-31 DIAGNOSIS — Z0000 Encounter for general adult medical examination without abnormal findings: Secondary | ICD-10-CM | POA: Insufficient documentation

## 2018-10-31 DIAGNOSIS — Z1159 Encounter for screening for other viral diseases: Secondary | ICD-10-CM

## 2018-10-31 DIAGNOSIS — R030 Elevated blood-pressure reading, without diagnosis of hypertension: Secondary | ICD-10-CM

## 2018-10-31 DIAGNOSIS — Z131 Encounter for screening for diabetes mellitus: Secondary | ICD-10-CM

## 2018-10-31 DIAGNOSIS — Z13220 Encounter for screening for lipoid disorders: Secondary | ICD-10-CM

## 2018-10-31 NOTE — Progress Notes (Signed)
C Subjective:     Casey Castaneda is a 62 y.o. female and is here for a comprehensive physical exam. The patient reports no problems.  Social History   Socioeconomic History  . Marital status: Married    Spouse name: Not on file  . Number of children: Not on file  . Years of education: Not on file  . Highest education level: Not on file  Occupational History  . Not on file  Social Needs  . Financial resource strain: Not on file  . Food insecurity:    Worry: Not on file    Inability: Not on file  . Transportation needs:    Medical: Not on file    Non-medical: Not on file  Tobacco Use  . Smoking status: Former Games developer  . Smokeless tobacco: Never Used  Substance and Sexual Activity  . Alcohol use: Yes    Comment: red wine 4 q wk  . Drug use: No  . Sexual activity: Not on file  Lifestyle  . Physical activity:    Days per week: Not on file    Minutes per session: Not on file  . Stress: Not on file  Relationships  . Social connections:    Talks on phone: Not on file    Gets together: Not on file    Attends religious service: Not on file    Active member of club or organization: Not on file    Attends meetings of clubs or organizations: Not on file    Relationship status: Not on file  . Intimate partner violence:    Fear of current or ex partner: Not on file    Emotionally abused: Not on file    Physically abused: Not on file    Forced sexual activity: Not on file  Other Topics Concern  . Not on file  Social History Narrative  . Not on file   Health Maintenance  Topic Date Due  . Hepatitis C Screening  10/04/1956  . PAP SMEAR  08/26/1976  . MAMMOGRAM  08/26/2005  . COLONOSCOPY  08/26/2005  . HIV Screening  07/14/2019 (Originally 08/26/1970)  . TETANUS/TDAP  12/12/2024  . INFLUENZA VACCINE  Completed    The following portions of the patient's history were reviewed and updated as appropriate: allergies, current medications, past family history, past medical history,  past social history, past surgical history and problem list.  Review of Systems A comprehensive review of systems was negative.   Objective:    BP (!) 149/63   Pulse 60   Ht 5\' 4"  (1.626 m)   Wt 143 lb (64.9 kg)   BMI 24.55 kg/m  General appearance: alert, cooperative and appears stated age Head: Normocephalic, without obvious abnormality, atraumatic Eyes: conjunctivae/corneas clear. PERRL, EOM's intact. Fundi benign. Ears: normal TM's and external ear canals both ears and bilateral impacted cerumen. irrigated and TM's clear.  Nose: Nares normal. Septum midline. Mucosa normal. No drainage or sinus tenderness. Throat: lips, mucosa, and tongue normal; teeth and gums normal Neck: no adenopathy, no carotid bruit, no JVD, supple, symmetrical, trachea midline and thyroid not enlarged, symmetric, no tenderness/mass/nodules Back: symmetric, no curvature. ROM normal. No CVA tenderness. Lungs: clear to auscultation bilaterally Heart: regular rate and rhythm, S1, S2 normal, no murmur, click, rub or gallop Abdomen: soft, non-tender; bowel sounds normal; no masses,  no organomegaly Pelvic: cervix normal in appearance, external genitalia normal, no adnexal masses or tenderness, no cervical motion tenderness, uterus normal size, shape, and consistency and vagina normal  without discharge Extremities: extremities normal, atraumatic, no cyanosis or edema Pulses: 2+ and symmetric Skin: Skin color, texture, turgor normal. No rashes or lesions Lymph nodes: Cervical, supraclavicular, and axillary nodes normal. Neurologic: Alert and oriented X 3, normal strength and tone. Normal symmetric reflexes. Normal coordination and gait    Assessment:    Healthy female exam.      Plan:    [name].[name].Toney ReilDaisy was seen today for annual exam.  Diagnoses and all orders for this visit:  Routine physical examination -     Hepatitis C Antibody -     Lipid Panel w/reflex Direct LDL -     COMPLETE METABOLIC PANEL WITH GFR -      Cytology - PAP  Need for hepatitis C screening test -     Hepatitis C Antibody  Screening for lipid disorders -     Lipid Panel w/reflex Direct LDL  Screening for diabetes mellitus -     COMPLETE METABOLIC PANEL WITH GFR  Bilateral impacted cerumen  Elevated blood pressure reading   .[name]. Depression screen [facility] 10/31/2018 07/13/2018  Decreased Interest 0 0  Down, Depressed, Hopeless 0 0  PHQ - 2 Score 0 0  Altered sleeping 0 -  Change in appetite 0 -  Feeling bad or failure about yourself  0 -  Trouble concentrating 0 -  Moving slowly or fidgety/restless 0 -  Suicidal thoughts 0 -  PHQ-9 Score 0 -  Difficult doing work/chores Not difficult at all -   .. Discussed 150 minutes of exercise a week.  Encouraged vitamin D 1000 units and Calcium 1300mg  or 4 servings of dairy a day.  Mammogram done.  Colonoscopy done.  Pap done today. Declined STI testing.  Fasting labs ordered.   Discussed elevated BP. Discussed goal BP under 150/90 for 60 years and older. Watch diet and try to uphold low salt diet/low processed food. Keep checking outside of office. Follow up in the next 1 to 2 months.   [name]..Cerumen Removal Template: Indication: Cerumen impaction of the ear(s) Medical necessity statement: On physical examination, cerumen impairs clinically significant portions of the external auditory canal, and tympanic membrane. Noted obstructive, copious cerumen that cannot be removed without magnification and instrumentations requiring physician skills Consent: Discussed benefits and risks of procedure and verbal consent obtained Procedure: Patient was prepped for the procedure. Utilized an otoscope to assess and take note of the ear canal, the tympanic membrane, and the presence, amount, and placement of the cerumen. Gentle water irrigation and soft plastic curette was utilized to remove cerumen.  Post procedure examination shows cerumen was completely removed. Patient tolerated procedure  well. The patient is made aware that they may experience temporary vertigo, temporary hearing loss, and temporary discomfort. If these symptom last for more than 24 hours to call the clinic or proceed to the ED.   See After Visit Summary for Counseling Recommendations

## 2018-10-31 NOTE — Patient Instructions (Signed)
Food Choices to Help Relieve Diarrhea, Adult When you have diarrhea, the foods you eat and your eating habits are very important. Choosing the right foods and drinks can help:  Relieve diarrhea.  Replace lost fluids and nutrients.  Prevent dehydration.  What general guidelines should I follow? Relieving diarrhea  Choose foods with less than 2 g or .07 oz. of fiber per serving.  Limit fats to less than 8 tsp (38 g or 1.34 oz.) a day.  Avoid the following: ? Foods and beverages sweetened with high-fructose corn syrup, honey, or sugar alcohols such as xylitol, sorbitol, and mannitol. ? Foods that contain a lot of fat or sugar. ? Fried, greasy, or spicy foods. ? High-fiber grains, breads, and cereals. ? Raw fruits and vegetables.  Eat foods that are rich in probiotics. These foods include dairy products such as yogurt and fermented milk products. They help increase healthy bacteria in the stomach and intestines (gastrointestinal tract, or GI tract).  If you have lactose intolerance, avoid dairy products. These may make your diarrhea worse.  Take medicine to help stop diarrhea (antidiarrheal medicine) only as told by your health care provider. Replacing nutrients  Eat small meals or snacks every 3-4 hours.  Eat bland foods, such as white rice, toast, or baked potato, until your diarrhea starts to get better. Gradually reintroduce nutrient-rich foods as tolerated or as told by your health care provider. This includes: ? Well-cooked protein foods. ? Peeled, seeded, and soft-cooked fruits and vegetables. ? Low-fat dairy products.  Take vitamin and mineral supplements as told by your health care provider. Preventing dehydration   Start by sipping water or a special solution to prevent dehydration (oral rehydration solution, ORS). Urine that is clear or pale yellow means that you are getting enough fluid.  Try to drink at least 8-10 cups of fluid each day to help replace lost  fluids.  You may add other liquids in addition to water, such as clear juice or decaffeinated sports drinks, as tolerated or as told by your health care provider.  Avoid drinks with caffeine, such as coffee, tea, or soft drinks.  Avoid alcohol. What foods are recommended? The items listed may not be a complete list. Talk with your health care provider about what dietary choices are best for you. Grains White rice. White, [place], or pita breads (fresh or toasted), including plain rolls, buns, or bagels. White pasta. Saltine, soda, or graham crackers. Pretzels. Low-fiber cereal. Cooked cereals made with water (such as cornmeal, farina, or cream cereals). Plain muffins. Matzo. Melba toast. Zwieback. Vegetables Potatoes (without the skin). Most well-cooked and canned vegetables without skins or seeds. Tender lettuce. Fruits Apple sauce. Fruits canned in juice. Cooked apricots, cherries, grapefruit, peaches, pears, or plums. Fresh bananas and cantaloupe. Meats and other protein foods Baked or boiled chicken. Eggs. Tofu. Fish. Seafood. Smooth nut butters. Ground or well-cooked tender beef, ham, veal, lamb, pork, or poultry. Dairy Plain yogurt, kefir, and unsweetened liquid yogurt. Lactose-free milk, buttermilk, skim milk, or soy milk. Low-fat or nonfat hard cheese. Beverages Water. Low-calorie sports drinks. Fruit juices without pulp. Strained tomato and vegetable juices. Decaffeinated teas. Sugar-free beverages not sweetened with sugar alcohols. Oral rehydration solutions, if approved by your health care provider. Seasoning and other foods Bouillon, broth, or soups made from recommended foods. What foods are not recommended? The items listed may not be a complete list. Talk with your health care provider about what dietary choices are best for you. Grains Whole grain, whole wheat,  bran, or rye breads, rolls, pastas, and crackers. Wild or brown rice. Whole grain or bran cereals. Barley. Oats and  oatmeal. Corn tortillas or taco shells. Granola. Popcorn. Vegetables Raw vegetables. Fried vegetables. Cabbage, broccoli, Brussels sprouts, artichokes, baked beans, beet greens, corn, kale, legumes, peas, sweet potatoes, and yams. Potato skins. Cooked spinach and cabbage. Fruits Dried fruit, including raisins and dates. Raw fruits. Stewed or dried prunes. Canned fruits with syrup. Meat and other protein foods Fried or fatty meats. Deli meats. Chunky nut butters. Nuts and seeds. Beans and lentils. Berniece Salines. Hot dogs. Sausage. Dairy High-fat cheeses. Whole milk, chocolate milk, and beverages made with milk, such as milk shakes. Half-and-half. Cream. sour cream. Ice cream. Beverages Caffeinated beverages (such as coffee, tea, soda, or energy drinks). Alcoholic beverages. Fruit juices with pulp. Prune juice. Soft drinks sweetened with high-fructose corn syrup or sugar alcohols. High-calorie sports drinks. Fats and oils Butter. Cream sauces. Margarine. Salad oils. Plain salad dressings. Olives. Avocados. Mayonnaise. Sweets and desserts Sweet rolls, doughnuts, and sweet breads. Sugar-free desserts sweetened with sugar alcohols such as xylitol and sorbitol. Seasoning and other foods Honey. Hot sauce. Chili powder. Gravy. Cream-based or milk-based soups. Pancakes and waffles. Summary  When you have diarrhea, the foods you eat and your eating habits are very important.  Make sure you get at least 8-10 cups of fluid each day, or enough to keep your urine clear or pale yellow.  Eat bland foods and gradually reintroduce healthy, nutrient-rich foods as tolerated, or as told by your health care provider.  Avoid high-fiber, fried, greasy, or spicy foods. This information is not intended to replace advice given to you by your health care provider. Make sure you discuss any questions you have with your health care provider. Document Released: 02/18/2004 Document Revised: 11/25/2016 Document Reviewed:  11/25/2016 Elsevier Interactive Patient Education  2018 [employer] Maintenance for Postmenopausal Women Menopause is a normal process in which your reproductive ability comes to an end. This process happens gradually over a span of months to years, usually between the ages of 24 and 6. Menopause is complete when you have missed 12 consecutive menstrual periods. It is important to talk with your health care provider about some of the most common conditions that affect postmenopausal women, such as heart disease, cancer, and bone loss (osteoporosis). Adopting a healthy lifestyle and getting preventive care can help to promote your health and wellness. Those actions can also lower your chances of developing some of these common conditions. What should I know about menopause? During menopause, you may experience a number of symptoms, such as:  Moderate-to-severe hot flashes.  Night sweats.  Decrease in sex drive.  Mood swings.  Headaches.  Tiredness.  Irritability.  Memory problems.  Insomnia.  Choosing to treat or not to treat menopausal changes is an individual decision that you make with your health care provider. What should I know about hormone replacement therapy and supplements? Hormone therapy products are effective for treating symptoms that are associated with menopause, such as hot flashes and night sweats. Hormone replacement carries certain risks, especially as you become older. If you are thinking about using estrogen or estrogen with progestin treatments, discuss the benefits and risks with your health care provider. What should I know about heart disease and stroke? Heart disease, heart attack, and stroke become more likely as you age. This may be due, in part, to the hormonal changes that your body experiences during menopause. These can affect how your body processes dietary  fats, triglycerides, and cholesterol. Heart attack and stroke are both medical  emergencies. There are many things that you can do to help prevent heart disease and stroke:  Have your blood pressure checked at least every 1-2 years. High blood pressure causes heart disease and increases the risk of stroke.  If you are 21-44 years old, ask your health care provider if you should take aspirin to prevent a heart attack or a stroke.  Do not use any tobacco products, including cigarettes, chewing tobacco, or electronic cigarettes. If you need help quitting, ask your health care provider.  It is important to eat a healthy diet and maintain a healthy weight. ? Be sure to include plenty of vegetables, fruits, low-fat dairy products, and lean protein. ? Avoid eating foods that are high in solid fats, added sugars, or salt (sodium).  Get regular exercise. This is one of the most important things that you can do for your health. ? Try to exercise for at least 150 minutes each week. The type of exercise that you do should increase your heart rate and make you sweat. This is known as moderate-intensity exercise. ? Try to do strengthening exercises at least twice each week. Do these in addition to the moderate-intensity exercise.  Know your numbers.Ask your health care provider to check your cholesterol and your blood glucose. Continue to have your blood tested as directed by your health care provider.  What should I know about cancer screening? There are several types of cancer. Take the following steps to reduce your risk and to catch any cancer development as early as possible. Breast Cancer  Practice breast self-awareness. ? This means understanding how your breasts normally appear and feel. ? It also means doing regular breast self-exams. Let your health care provider know about any changes, no matter how small.  If you are 68 or older, have a clinician do a breast exam (clinical breast exam or CBE) every year. Depending on your age, family history, and medical history, it  may be recommended that you also have a yearly breast X-ray (mammogram).  If you have a family history of breast cancer, talk with your health care provider about genetic screening.  If you are at high risk for breast cancer, talk with your health care provider about having an MRI and a mammogram every year.  Breast cancer (BRCA) gene test is recommended for women who have family members with BRCA-related cancers. Results of the assessment will determine the need for genetic counseling and BRCA1 and for BRCA2 testing. BRCA-related cancers include these types: ? Breast. This occurs in males or females. ? Ovarian. ? Tubal. This may also be called fallopian tube cancer. ? Cancer of the abdominal or pelvic lining (peritoneal cancer). ? Prostate. ? Pancreatic.  Cervical, Uterine, and Ovarian Cancer Your health care provider may recommend that you be screened regularly for cancer of the pelvic organs. These include your ovaries, uterus, and vagina. This screening involves a pelvic exam, which includes checking for microscopic changes to the surface of your cervix (Pap test).  For women ages 21-65, health care providers may recommend a pelvic exam and a Pap test every three years. For women ages 33-65, they may recommend the Pap test and pelvic exam, combined with testing for human papilloma virus (HPV), every five years. Some types of HPV increase your risk of cervical cancer. Testing for HPV may also be done on women of any age who have unclear Pap test results.  Other health  care providers may not recommend any screening for nonpregnant women who are considered low risk for pelvic cancer and have no symptoms. Ask your health care provider if a screening pelvic exam is right for you.  If you have had past treatment for cervical cancer or a condition that could lead to cancer, you need Pap tests and screening for cancer for at least 20 years after your treatment. If Pap tests have been discontinued  for you, your risk factors (such as having a new sexual partner) need to be reassessed to determine if you should start having screenings again. Some women have medical problems that increase the chance of getting cervical cancer. In these cases, your health care provider may recommend that you have screening and Pap tests more often.  If you have a family history of uterine cancer or ovarian cancer, talk with your health care provider about genetic screening.  If you have vaginal bleeding after reaching menopause, tell your health care provider.  There are currently no reliable tests available to screen for ovarian cancer.  Lung Cancer Lung cancer screening is recommended for adults 28-68 years old who are at high risk for lung cancer because of a history of smoking. A yearly low-dose CT scan of the lungs is recommended if you:  Currently smoke.  Have a history of at least 30 pack-years of smoking and you currently smoke or have quit within the past 15 years. A pack-year is smoking an average of one pack of cigarettes per day for one year.  Yearly screening should:  Continue until it has been 15 years since you quit.  Stop if you develop a health problem that would prevent you from having lung cancer treatment.  Colorectal Cancer  This type of cancer can be detected and can often be prevented.  Routine colorectal cancer screening usually begins at age 21 and continues through age 72.  If you have risk factors for colon cancer, your health care provider may recommend that you be screened at an earlier age.  If you have a family history of colorectal cancer, talk with your health care provider about genetic screening.  Your health care provider may also recommend using home test kits to check for hidden blood in your stool.  A small camera at the end of a tube can be used to examine your colon directly (sigmoidoscopy or colonoscopy). This is done to check for the earliest forms of  colorectal cancer.  Direct examination of the colon should be repeated every 5-10 years until age 27. However, if early forms of precancerous polyps or small growths are found or if you have a family history or genetic risk for colorectal cancer, you may need to be screened more often.  Skin Cancer  Check your skin from head to toe regularly.  Monitor any moles. Be sure to tell your health care provider: ? About any new moles or changes in moles, especially if there is a change in a mole's shape or color. ? If you have a mole that is larger than the size of a pencil eraser.  If any of your family members has a history of skin cancer, especially at a young age, talk with your health care provider about genetic screening.  Always use sunscreen. Apply sunscreen liberally and repeatedly throughout the day.  Whenever you are outside, protect yourself by wearing long sleeves, pants, a wide-brimmed hat, and sunglasses.  What should I know about osteoporosis? Osteoporosis is a condition in which  bone destruction happens more quickly than new bone creation. After menopause, you may be at an increased risk for osteoporosis. To help prevent osteoporosis or the bone fractures that can happen because of osteoporosis, the following is recommended:  If you are 39-72 years old, get at least 1,000 mg of calcium and at least 600 mg of vitamin D per day.  If you are older than age 56 but younger than age 41, get at least 1,200 mg of calcium and at least 600 mg of vitamin D per day.  If you are older than age 99, get at least 1,200 mg of calcium and at least 800 mg of vitamin D per day.  Smoking and excessive alcohol intake increase the risk of osteoporosis. Eat foods that are rich in calcium and vitamin D, and do weight-bearing exercises several times each week as directed by your health care provider. What should I know about how menopause affects my mental health? Depression may occur at any age, but it  is more common as you become older. Common symptoms of depression include:  Low or sad mood.  Changes in sleep patterns.  Changes in appetite or eating patterns.  Feeling an overall lack of motivation or enjoyment of activities that you previously enjoyed.  Frequent crying spells.  Talk with your health care provider if you think that you are experiencing depression. What should I know about immunizations? It is important that you get and maintain your immunizations. These include:  Tetanus, diphtheria, and pertussis (Tdap) booster vaccine.  Influenza every year before the flu season begins.  Pneumonia vaccine.  Shingles vaccine.  Your health care provider may also recommend other immunizations. This information is not intended to replace advice given to you by your health care provider. Make sure you discuss any questions you have with your health care provider. Document Released: 01/20/2006 Document Revised: 06/17/2016 Document Reviewed: 09/01/2015 Elsevier Interactive Patient Education  2018 [employer].

## 2018-10-31 NOTE — Telephone Encounter (Signed)
Mammogram needs to be abstracted  Out of care everywhere and colonsoscopy done through [facility] in [place]. Can we try to locate.

## 2018-11-01 ENCOUNTER — Encounter: Payer: Self-pay | Admitting: Physician Assistant

## 2018-11-01 NOTE — Telephone Encounter (Signed)
Abstracted Mammogram. Sent request for Colonoscopy records.

## 2018-11-02 LAB — CYTOLOGY - PAP
Diagnosis: NEGATIVE
HPV (WINDOPATH): NOT DETECTED

## 2018-11-02 NOTE — Progress Notes (Signed)
Call pt: normal pap smear with negative HPV. Great news. No more pap smears yay!

## 2018-11-29 ENCOUNTER — Encounter: Payer: Self-pay | Admitting: Physician Assistant

## 2019-02-28 ENCOUNTER — Telehealth: Payer: Self-pay | Admitting: Physician Assistant

## 2019-02-28 NOTE — Telephone Encounter (Signed)
Patient had left a VM stating that due to her health issues she is requesting a letter to be out for a month at this time. Her boss will let her work remotely from home if she has a letter from her PCP due to her concern of this pandemic. 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 Please advise.

## 2019-03-01 ENCOUNTER — Encounter: Payer: Self-pay | Admitting: Physician Assistant

## 2019-03-01 NOTE — Telephone Encounter (Signed)
Letter written. She can print or we can print and fax. Or she can pick up in office.

## 2019-03-01 NOTE — Telephone Encounter (Signed)
Called patient and she states that she will log into her mychart and print the letter off. If she has any trouble she will have her husband pick up a copy of the letter. No further questions during the call.

## 2019-07-19 ENCOUNTER — Ambulatory Visit (INDEPENDENT_AMBULATORY_CARE_PROVIDER_SITE_OTHER): Payer: 59 | Admitting: Physician Assistant

## 2019-07-19 ENCOUNTER — Telehealth: Payer: Self-pay

## 2019-07-19 ENCOUNTER — Other Ambulatory Visit: Payer: Self-pay

## 2019-07-19 VITALS — BP 128/80 | HR 68 | Temp 98.7°F | Ht 64.0 in | Wt 143.0 lb

## 2019-07-19 DIAGNOSIS — Z23 Encounter for immunization: Secondary | ICD-10-CM | POA: Diagnosis not present

## 2019-07-19 DIAGNOSIS — Z13220 Encounter for screening for lipoid disorders: Secondary | ICD-10-CM

## 2019-07-19 DIAGNOSIS — Z0000 Encounter for general adult medical examination without abnormal findings: Secondary | ICD-10-CM | POA: Diagnosis not present

## 2019-07-19 DIAGNOSIS — Z131 Encounter for screening for diabetes mellitus: Secondary | ICD-10-CM | POA: Diagnosis not present

## 2019-07-19 DIAGNOSIS — Z1159 Encounter for screening for other viral diseases: Secondary | ICD-10-CM

## 2019-07-19 NOTE — Telephone Encounter (Signed)
Patient advised of recommendations.  

## 2019-07-19 NOTE — Progress Notes (Signed)
Subjective:     Casey Castaneda is a 63 y.o. female and is here for a comprehensive physical exam. The patient reports no problems.  Social History   Socioeconomic History  . Marital status: Married    Spouse name: Not on file  . Number of children: Not on file  . Years of education: Not on file  . Highest education level: Not on file  Occupational History  . Not on file  Social Needs  . Financial resource strain: Not on file  . Food insecurity    Worry: Not on file    Inability: Not on file  . Transportation needs    Medical: Not on file    Non-medical: Not on file  Tobacco Use  . Smoking status: Former Games developermoker  . Smokeless tobacco: Never Used  Substance and Sexual Activity  . Alcohol use: Yes    Comment: red wine 4 q wk  . Drug use: No  . Sexual activity: Not on file  Lifestyle  . Physical activity    Days per week: Not on file    Minutes per session: Not on file  . Stress: Not on file  Relationships  . Social Musicianconnections    Talks on phone: Not on file    Gets together: Not on file    Attends religious service: Not on file    Active member of club or organization: Not on file    Attends meetings of clubs or organizations: Not on file    Relationship status: Not on file  . Intimate partner violence    Fear of current or ex partner: Not on file    Emotionally abused: Not on file    Physically abused: Not on file    Forced sexual activity: Not on file  Other Topics Concern  . Not on file  Social History Narrative  . Not on file   Health Maintenance  Topic Date Due  . Hepatitis C Screening  1956/09/01  . HIV Screening  08/26/1970  . INFLUENZA VACCINE  07/13/2019  . MAMMOGRAM  10/12/2020  . PAP SMEAR-Modifier  10/31/2021  . COLONOSCOPY  12/13/2023  . TETANUS/TDAP  12/12/2024    The following portions of the patient's history were reviewed and updated as appropriate: allergies, current medications, past family history, past medical history, past social history,  past surgical history and problem list.  Review of Systems A comprehensive review of systems was negative.   Objective:    BP 128/80   Pulse 68   Temp 98.7 F (37.1 C) (Oral)   Ht 5\' 4"  (1.626 m)   Wt 143 lb (64.9 kg)   SpO2 100%   BMI 24.55 kg/m  General appearance: alert, cooperative and appears stated age Head: Normocephalic, without obvious abnormality, atraumatic Eyes: conjunctivae/corneas clear. PERRL, EOM's intact. Fundi benign. Ears: normal TM's and external ear canals both ears Nose: Nares normal. Septum midline. Mucosa normal. No drainage or sinus tenderness. Throat: lips, mucosa, and tongue normal; teeth and gums normal Neck: no adenopathy, no carotid bruit, no JVD, supple, symmetrical, trachea midline and thyroid not enlarged, symmetric, no tenderness/mass/nodules Back: symmetric, no curvature. ROM normal. No CVA tenderness. Lungs: clear to auscultation bilaterally Heart: regular rate and rhythm, S1, S2 normal, no murmur, click, rub or gallop Abdomen: soft, non-tender; bowel sounds normal; no masses,  no organomegaly Extremities: extremities normal, atraumatic, no cyanosis or edema Pulses: 2+ and symmetric Skin: Skin color, texture, turgor normal. No rashes or lesions Lymph nodes: Cervical, supraclavicular, and  axillary nodes normal. Neurologic: Alert and oriented X 3, normal strength and tone. Normal symmetric reflexes. Normal coordination and gait    Assessment:    Healthy female exam.      Plan:    Marland KitchenMarland KitchenIliza was seen today for annual exam.  Diagnoses and all orders for this visit:  Routine physical examination -     Lipid Panel w/reflex Direct LDL -     COMPLETE METABOLIC PANEL WITH GFR -     Hepatitis C Antibody  Screening for lipid disorders -     Lipid Panel w/reflex Direct LDL  Screening for diabetes mellitus -     COMPLETE METABOLIC PANEL WITH GFR  Need for hepatitis C screening test -     Hepatitis C Antibody  Need for shingles vaccine -      Varicella-zoster vaccine IM   .[name] Depression screen [facility] 2/9 07/19/2019 10/31/2018 07/13/2018  Decreased Interest 0 0 0  Down, Depressed, Hopeless 0 0 0  PHQ - 2 Score 0 0 0  Altered sleeping 0 0 -  Tired, decreased energy 0 - -  Change in appetite 0 0 -  Feeling bad or failure about yourself  0 0 -  Trouble concentrating 0 0 -  Moving slowly or fidgety/restless 0 0 -  Suicidal thoughts 0 0 -  PHQ-9 Score 0 0 -  Difficult doing work/chores Not difficult at all Not difficult at all -   .. Discussed 150 minutes of exercise a week.  Encouraged vitamin D 1000 units and Calcium 1300mg  or 4 servings of dairy a day.  Fasting labs ordered.  Mammogram up to date.  Colonoscopy up to date.  Discussed shingles. First shot given today.   See After Visit Summary for Counseling Recommendations

## 2019-07-19 NOTE — Telephone Encounter (Signed)
Casey Castaneda states she is having severe shoulder pain due to getting the shingles vaccine. She did take Tramadol 50 mg around 1 pm today. She states it only just eased off the pain. She states she has Tramadol from a dental procedure. She wanted to know if she could take more than 50 mg and if so how often. Denies weakness.

## 2019-07-19 NOTE — Patient Instructions (Signed)

## 2019-07-19 NOTE — Telephone Encounter (Signed)
Do some cool compresses. She can take 50mg  every 6 hours for pain. She could take ibuprofen 800mg  every 8 hours. Pain at injection site usually improves significantly at 3 days.

## 2019-07-21 ENCOUNTER — Encounter: Payer: Self-pay | Admitting: Physician Assistant

## 2019-08-01 LAB — LIPID PANEL
Cholesterol: 210 — AB (ref 0–200)
HDL: 111 — AB (ref 35–70)
LDL Cholesterol: 91
LDl/HDL Ratio: 0.8
Triglycerides: 41 (ref 40–160)

## 2019-08-01 LAB — HEMOGLOBIN A1C: Hemoglobin A1C: 4.9

## 2019-08-01 LAB — BASIC METABOLIC PANEL
BUN: 14 (ref 4–21)
CO2: 25 — AB (ref 13–22)
Chloride: 100 (ref 99–108)
Creatinine: 0.9 (ref 0.5–1.1)
Glucose: 88
Potassium: 4.6 (ref 3.4–5.3)
Sodium: 140 (ref 137–147)

## 2019-08-01 LAB — COMPREHENSIVE METABOLIC PANEL
Albumin: 4.8 (ref 3.5–5.0)
Calcium: 9.4 (ref 8.7–10.7)
Globulin: 2.5

## 2019-08-01 LAB — HEPATIC FUNCTION PANEL
ALT: 14 (ref 7–35)
AST: 23 (ref 13–35)
Alkaline Phosphatase: 53 (ref 25–125)
Bilirubin, Total: 0.5

## 2019-08-01 LAB — CBC AND DIFFERENTIAL
HCT: 42 (ref 36–46)
Hemoglobin: 13.3 (ref 12.0–16.0)
Platelets: 304 (ref 150–399)
WBC: 2.9

## 2019-08-21 ENCOUNTER — Telehealth: Payer: Self-pay

## 2019-08-21 DIAGNOSIS — Z1239 Encounter for other screening for malignant neoplasm of breast: Secondary | ICD-10-CM

## 2019-08-21 NOTE — Telephone Encounter (Signed)
Patient called inquiring about mammogram. Order placed for her, she is due November.

## 2019-09-03 ENCOUNTER — Ambulatory Visit (INDEPENDENT_AMBULATORY_CARE_PROVIDER_SITE_OTHER): Payer: 59 | Admitting: Physician Assistant

## 2019-09-03 ENCOUNTER — Other Ambulatory Visit: Payer: Self-pay

## 2019-09-03 DIAGNOSIS — Z23 Encounter for immunization: Secondary | ICD-10-CM

## 2019-09-18 ENCOUNTER — Ambulatory Visit: Payer: 59

## 2019-10-14 ENCOUNTER — Ambulatory Visit (INDEPENDENT_AMBULATORY_CARE_PROVIDER_SITE_OTHER): Payer: 59 | Admitting: Physician Assistant

## 2019-10-14 ENCOUNTER — Other Ambulatory Visit: Payer: Self-pay

## 2019-10-14 VITALS — Temp 98.2°F | Wt 143.0 lb

## 2019-10-14 DIAGNOSIS — Z23 Encounter for immunization: Secondary | ICD-10-CM

## 2019-10-14 NOTE — Progress Notes (Signed)
Pt in today for shingrix vaccine. This is 2 of 2 of the shingrix series. Vitals taken and no fever noted. Vaccine was given in R deltoid. Pt tolerated well with no immediate complications.  Agree with above plan. Iran Planas PA-C

## 2019-11-12 ENCOUNTER — Telehealth: Payer: Self-pay | Admitting: Physician Assistant

## 2019-11-12 DIAGNOSIS — D72819 Decreased white blood cell count, unspecified: Secondary | ICD-10-CM

## 2019-11-12 NOTE — Telephone Encounter (Signed)
Signed.

## 2019-11-12 NOTE — Telephone Encounter (Signed)
Lab slip mailed to patient.

## 2019-11-12 NOTE — Telephone Encounter (Signed)
Patient made aware of results and need for repeat lab work in a month. She is agreeable. CBC pended, please add any other labs needed.

## 2019-11-12 NOTE — Telephone Encounter (Signed)
Call pt: Labs received from [facility].   Kidney, liver, glucose look good.  A1C look great.  WBC is a little low still. No other concerns on CBC.  Lets recheck in 1 month with peripheral smear.  Cholesterol looks good.  HDL is amazing.

## 2019-11-13 ENCOUNTER — Encounter: Payer: Self-pay | Admitting: Physician Assistant

## 2019-11-27 ENCOUNTER — Ambulatory Visit (INDEPENDENT_AMBULATORY_CARE_PROVIDER_SITE_OTHER): Payer: 59

## 2019-11-27 ENCOUNTER — Other Ambulatory Visit: Payer: Self-pay

## 2019-11-27 ENCOUNTER — Encounter: Payer: Self-pay | Admitting: Sports Medicine

## 2019-11-27 ENCOUNTER — Ambulatory Visit (INDEPENDENT_AMBULATORY_CARE_PROVIDER_SITE_OTHER): Payer: 59 | Admitting: Sports Medicine

## 2019-11-27 DIAGNOSIS — M7542 Impingement syndrome of left shoulder: Secondary | ICD-10-CM

## 2019-11-27 DIAGNOSIS — M5030 Other cervical disc degeneration, unspecified cervical region: Secondary | ICD-10-CM

## 2019-11-27 DIAGNOSIS — Z1239 Encounter for other screening for malignant neoplasm of breast: Secondary | ICD-10-CM

## 2019-11-27 MED ORDER — PREDNISONE 50 MG PO TABS: ORAL_TABLET | ORAL | 0 refills | Status: DC

## 2019-11-27 NOTE — Assessment & Plan Note (Signed)
Prednisone, x-rays, formal PT, return to see me in 6 weeks, MRI versus injection if no better.   I do not think this is related to her shingles vaccine.

## 2019-11-27 NOTE — Progress Notes (Signed)
Subjective:    CC: Left neck and shoulder pain  HPI: This is a pleasant 63 year old female, she has pain in her left shoulder, localized over the deltoid and worse with overhead activities, moderate, persistent, localized without radiation.  In addition she has pain in her neck, with radiation over the trapezius, worse with prolonged downgaze.  I reviewed the past medical history, family history, social history, surgical history, and allergies today and no changes were needed.  Please see the problem list section below in epic for further details.  Past Medical History: Past Medical History:  Diagnosis Date  . Abnormal Pap smear of vagina   . Neck pain   . Shoulder pain    Past Surgical History: Past Surgical History:  Procedure Laterality Date  . CESAREAN SECTION     Social History: Social History   Socioeconomic History  . Marital status: Married    Spouse name: Not on file  . Number of children: Not on file  . Years of education: Not on file  . Highest education level: Not on file  Occupational History  . Not on file  Tobacco Use  . Smoking status: Former Games developer  . Smokeless tobacco: Never Used  Substance and Sexual Activity  . Alcohol use: Yes    Comment: red wine 4 q wk  . Drug use: No  . Sexual activity: Not on file  Other Topics Concern  . Not on file  Social History Narrative  . Not on file   Social Determinants of Health   Financial Resource Strain:   . Difficulty of Paying Living Expenses: Not on file  Food Insecurity:   . Worried About Programme researcher, broadcasting/film/video in the Last Year: Not on file  . Ran Out of Food in the Last Year: Not on file  Transportation Needs:   . Lack of Transportation (Medical): Not on file  . Lack of Transportation (Non-Medical): Not on file  Physical Activity:   . Days of Exercise per Week: Not on file  . Minutes of Exercise per Session: Not on file  Stress:   . Feeling of Stress : Not on file  Social Connections:   .  Frequency of Communication with Friends and Family: Not on file  . Frequency of Social Gatherings with Friends and Family: Not on file  . Attends Religious Services: Not on file  . Active Member of Clubs or Organizations: Not on file  . Attends Banker Meetings: Not on file  . Marital Status: Not on file   Family History: Family History  Problem Relation Age of Onset  . Leukemia Mother   . Hypertension Father    Allergies: Allergies  Allergen Reactions  . Sulfa Antibiotics    Medications: See med rec.  Review of Systems: No fevers, chills, night sweats, weight loss, chest pain, or shortness of breath.   Objective:    General: Well Developed, well nourished, and in no acute distress.  Neuro: Alert and oriented x3, extra-ocular muscles intact, sensation grossly intact.  HEENT: Normocephalic, atraumatic, pupils equal round reactive to light, neck supple, no masses, no lymphadenopathy, thyroid nonpalpable.  Skin: Warm and dry, no rashes. Cardiac: Regular rate and rhythm, no murmurs rubs or gallops, no lower extremity edema.  Respiratory: Clear to auscultation bilaterally. Not using accessory muscles, speaking in full sentences. Neck: Negative spurling's Full neck range of motion Grip strength and sensation normal in bilateral hands Strength good C4 to T1 distribution No sensory change to C4 to  T1 Reflexes normal Left shoulder: Inspection reveals no abnormalities, atrophy or asymmetry. Palpation is normal with no tenderness over AC joint or bicipital groove. ROM is full in all planes. Rotator cuff strength normal throughout. Positive Neer and Hawkin's tests, empty can. Speeds and Yergason's tests normal. No labral pathology noted with negative Obrien's, negative crank, negative clunk, and good stability. Normal scapular function observed. No painful arc and no drop arm sign. No apprehension sign  Impression and Recommendations:    DDD (degenerative disc  disease), cervical 5 days of prednisone, x-rays, formal PT.  Impingement syndrome, shoulder, left Prednisone, x-rays, formal PT, return to see me in 6 weeks, MRI versus injection if no better.   I do not think this is related to her shingles vaccine.   ___________________________________________ Gwen Her. Dianah Field, M.D., ABFM., CAQSM. Primary Care and Sports Medicine [facility]  Adjunct Professor of [employer]

## 2019-11-27 NOTE — Assessment & Plan Note (Signed)
5 days of prednisone, x-rays, formal PT.

## 2019-11-29 NOTE — Telephone Encounter (Signed)
Normal mammogram. Follow up in 1 year.

## 2019-12-10 ENCOUNTER — Encounter: Payer: Self-pay | Admitting: Physical Therapy

## 2019-12-10 ENCOUNTER — Ambulatory Visit (INDEPENDENT_AMBULATORY_CARE_PROVIDER_SITE_OTHER): Payer: 59 | Admitting: Physical Therapy

## 2019-12-10 ENCOUNTER — Other Ambulatory Visit: Payer: Self-pay

## 2019-12-10 DIAGNOSIS — M25512 Pain in left shoulder: Secondary | ICD-10-CM

## 2019-12-10 DIAGNOSIS — M25612 Stiffness of left shoulder, not elsewhere classified: Secondary | ICD-10-CM | POA: Diagnosis not present

## 2019-12-10 DIAGNOSIS — M6281 Muscle weakness (generalized): Secondary | ICD-10-CM

## 2019-12-10 DIAGNOSIS — M542 Cervicalgia: Secondary | ICD-10-CM | POA: Diagnosis not present

## 2019-12-10 NOTE — Therapy (Signed)
[facility] [place] [address] [place], [place], [address] Phone: [phone]   Fax:  [phone]  Physical Therapy Evaluation  Patient Details  Name: Casey Castaneda MRN: [id number] Date of Birth: 05-22-1956 Referring Provider (PT): Thekkekandam   Encounter Date: 12/10/2019  PT End of Session - 12/10/19 1018    Visit Number  1    Number of Visits  12    Date for PT Re-Evaluation  01/21/20    PT Start Time  1018    PT Stop Time  1104    PT Time Calculation (min)  46 min    Activity Tolerance  Patient tolerated treatment well    Behavior During Therapy  [facility] for tasks assessed/performed       Past Medical History:  Diagnosis Date  . Abnormal Pap smear of vagina   . Neck pain   . Shoulder pain     Past Surgical History:  Procedure Laterality Date  . CESAREAN SECTION      There were no vitals filed for this visit.   Subjective Assessment - 12/10/19 1025    Subjective  Patient has had neck pain for 7-10 years mainly on the left side. Occassionally needing heat. About 6 months ago she had a shingles shot and that's when her shoulder began hurting. Lifting her arm overhead or pushing through her arm to get up from a chair bother it. Her bra strap also irritates the area.    Pertinent History  arthritis, neck pain    Diagnostic tests  xrays - degenerative changes    Patient Stated Goals  get ROM back without pain and gain strength to lift grandbabies    Currently in Pain?  Yes    Pain Score  3     Pain Location  Neck    Pain Orientation  Left    Pain Descriptors / Indicators  Aching;Dull    Pain Type  Chronic pain    Pain Radiating Towards  left shoulder deltoid region    Pain Onset  More than a month ago    Pain Frequency  Intermittent    Aggravating Factors   shoulder movements OH and pushing down    Pain Relieving Factors  rest, heat    Effect of Pain on Daily Activities  unable to lift grandbabies          [facility] PT Assessment - 12/10/19 0001      Assessment   Medical Diagnosis  Cervical DDD, Left RC dysfunction    Referring Provider (PT)  Thekkekandam    Onset Date/Surgical Date  05/13/19    Next MD Visit  after PT      Precautions   Precautions  Other (comment)    Precaution Comments  Sulfa allergy (no ionto)      Restrictions   Weight Bearing Restrictions  No      Balance Screen   Has the patient fallen in the past 6 months  No    Has the patient had a decrease in activity level because of a fear of falling?   No    Is the patient reluctant to leave their home because of a fear of falling?   No      Home Public house manager residence      Prior Function   Level of Independence  Independent    Vocation  Full time employment    Vocation Requirements  sitting at computer  Leisure  grandchildren      Posture/Postural Control   Posture Comments  tight right pecs and winged scapula      ROM / Strength   AROM / PROM / Strength  AROM;Strength;PROM      AROM   AROM Assessment Site  Shoulder    Right/Left Shoulder  Left    Left Shoulder Extension  65 Degrees    Left Shoulder Flexion  153 Degrees    Left Shoulder ABduction  155 Degrees    Left Shoulder Internal Rotation  43 Degrees    Left Shoulder External Rotation  --   full   Cervical Flexion  full    Cervical Extension  full    Cervical - Right Side Bend  34    Cervical - Left Side Bend  32    Cervical - Right Rotation  78    Cervical - Left Rotation  72      PROM   PROM Assessment Site  Shoulder    Right/Left Shoulder  Left    Left Shoulder Flexion  160 Degrees    Left Shoulder ABduction  162 Degrees    Left Shoulder Internal Rotation  70 Degrees      Strength   Overall Strength Comments  cervical flex/ext/LSB 4+/5, else 5/5    Strength Assessment Site  Shoulder    Right/Left Shoulder  Left    Left Shoulder Flexion  4+/5    Left Shoulder Extension  5/5    Left Shoulder  ABduction  4/5   pain   Left Shoulder Internal Rotation  5/5    Left Shoulder External Rotation  4+/5      Palpation   Spinal mobility  WFL    Palpation comment  left UT and levator scapula tender, left subscapularis muscle belly       Special Tests   Other special tests  unremarkable                Objective measurements completed on examination: See above findings.      [facility] Adult PT Treatment/Exercise - 12/10/19 0001      Manual Therapy   Manual Therapy  Soft tissue mobilization    Manual therapy comments  skilled palpation and monitoring of soft tissue during DN     Soft tissue mobilization  to left UT and levator scap       Trigger Point Dry Needling - 12/10/19 0001    Consent Given?  Yes    Education Handout Provided  Yes    Muscles Treated Head and Neck  Upper trapezius;Levator scapulae    Dry Needling Comments  left    Upper Trapezius Response  Twitch reponse elicited;Palpable increased muscle length    Levator Scapulae Response  Palpable increased muscle length           PT Education - 12/10/19 1107    Education Details  HEP, DN Education and aftercare    Person(s) Educated  Patient    Methods  Explanation;Demonstration;Handout    Comprehension  Verbalized understanding;Returned demonstration       PT Short Term Goals - 12/10/19 1401      PT SHORT TERM GOAL #1   Title  Independent with HEP for strengthening and flexibility to increase function.    Time  6    Period  Weeks    Status  New    Target Date  01/21/20      PT SHORT TERM GOAL #2   Title  Patient able to perform [facility] ADLs without pain in the left shoulder or neck.    Time  6    Period  Weeks    Status  New      PT SHORT TERM GOAL #3   Title  Patient to report no pain with full weightbearing through her LUE    Time  6    Period  Weeks    Status  New      PT SHORT TERM GOAL #4   Title  Pt to demo 5/5 left shoulder strength to normalize ADLS.    Time  6    Period  Weeks     Status  New      PT SHORT TERM GOAL #5   Title  Pt to report decreased pain in the neck by 50% or more with ADLS.    Time  6    Period  Weeks    Status  New        PT Long Term Goals - 12/10/19 1404      PT LONG TERM GOAL #1   Title  SEE STGs (LTG=STG)             Plan - 12/10/19 1345    Clinical Impression Statement  Patient presents with c/o of left neck and shoulder pain affecting ADLs including reaching OH and pushing up from a chair. She has had neck pain for years, but he shoulder pain began after having a Shingle's vaccine in October. She has limitations in neck lateral flexion and shoulder flex/ABD and IR. She also has weakness in the neck and shoulder. She will benefit from PT to address these deficits. She responded well to DN in her left UT and levator scapula today.    Personal Factors and Comorbidities  Comorbidity 1    Comorbidities  arthritis    Stability/Clinical Decision Making  Stable/Uncomplicated    Clinical Decision Making  Low    Rehab Potential  Excellent    PT Frequency  2x / week    PT Duration  6 weeks    PT Treatment/Interventions  ADLs/Self Care Home Management;Cryotherapy;Electrical Stimulation;Moist Heat;Ultrasound;Therapeutic exercise;Neuromuscular re-education;Manual techniques;Patient/family education;Dry needling;Passive range of motion;Taping;Joint Manipulations;Spinal Manipulations    PT Next Visit Plan  Review HEP, shoulder and scapular strengthening, assess DN (may need subscap), shoulder ROM; sulfa allergy (no ionto)    PT Home Exercise Plan  [id number]       Patient will benefit from skilled therapeutic intervention in order to improve the following deficits and impairments:  Decreased range of motion, Pain, Decreased strength, Impaired flexibility, Impaired UE functional use  Visit Diagnosis: Cervicalgia - Plan: PT plan of care cert/re-cert  Acute pain of left shoulder - Plan: PT plan of care cert/re-cert  Stiffness of left  shoulder, not elsewhere classified - Plan: PT plan of care cert/re-cert  Muscle weakness (generalized) - Plan: PT plan of care cert/re-cert     Problem List Patient Active Problem List   Diagnosis Date Noted  . Impingement syndrome, shoulder, left 11/27/2019  . DDD (degenerative disc disease), cervical 11/27/2019  . Bilateral impacted cerumen 10/31/2018  . Elevated blood pressure reading 10/31/2018  . Serum potassium elevated 07/13/2018  . Leukopenia 07/13/2018  . Muscle tightness 07/13/2018  . Neck pain 07/13/2018  . Primary osteoarthritis of left knee 06/04/2018    Madelyn Flavors PT 12/10/2019, 2:09 PM  [facility] [place], [place], [address] Phone: [phone]   Fax:  [phone]  Name: [name] MRN: [id number] Date of Birth: [birth year]

## 2019-12-10 NOTE — Patient Instructions (Addendum)
Access Code: [email or web address]  URL: [email or web address]  Date: 12/10/2019  Prepared by: Madelyn Flavors   Exercises Doorway Pec Stretch at 60 Degrees Abduction with Arm Straight - 3 reps - 1 sets - 30-60 sec hold - 2x daily - 7x weekly Seated Levator Scapulae Stretch - 3 reps - 30 hold - 2x daily - 7x weekly Seated Upper Trapezius Stretch - 3 reps - 30 hold - 2x daily - 7x weekly Seated Scapular Retraction - 10 reps - 1-33 sets - 2-3 sec hold - 2x daily - 7x weekly Patient Education Trigger Point Dry Needling

## 2019-12-12 ENCOUNTER — Encounter: Payer: Self-pay | Admitting: Physical Therapy

## 2019-12-12 ENCOUNTER — Ambulatory Visit (INDEPENDENT_AMBULATORY_CARE_PROVIDER_SITE_OTHER): Payer: 59 | Admitting: Physical Therapy

## 2019-12-12 ENCOUNTER — Other Ambulatory Visit: Payer: Self-pay

## 2019-12-12 DIAGNOSIS — M25612 Stiffness of left shoulder, not elsewhere classified: Secondary | ICD-10-CM

## 2019-12-12 DIAGNOSIS — M6281 Muscle weakness (generalized): Secondary | ICD-10-CM | POA: Diagnosis not present

## 2019-12-12 DIAGNOSIS — M25512 Pain in left shoulder: Secondary | ICD-10-CM

## 2019-12-12 DIAGNOSIS — M542 Cervicalgia: Secondary | ICD-10-CM

## 2019-12-12 NOTE — Patient Instructions (Signed)
Access Code: [email or web address]  URL: [email or web address]  Date: 12/12/2019  Prepared by: Kerin Perna   Exercises  Doorway Pec Stretch at 60 Degrees Abduction with Arm Straight - 3 reps - 1 sets - 30-60 sec hold - 2x daily - 7x weekly  Seated Levator Scapulae Stretch - 3 reps - 15 hold - 2x daily - 7x weekly  Seated Upper Trapezius Stretch - 3 reps - 15 hold - 2x daily - 7x weekly  Seated Scapular Retraction - 10 reps - 1-33 sets - 2-3 sec hold - 2x daily - 7x weekly  Doorway Pec Stretch at 120 Elevation with Arm Straight - 3 reps - 1 sets - 15-20 hold - 2x daily - 7x weekly  Supine Chest Stretch with Elbows Bent - 3 reps - 3 sets - 10-15 hold - 1x daily - 7x weekly

## 2019-12-12 NOTE — Therapy (Signed)
[facility] [address] [place], [place], [address] Phone: [phone]   Fax:  [phone]  Physical Therapy Treatment  Patient Details  Name: Casey Castaneda MRN: [id number] Date of Birth: 1956-01-07 Referring Provider (PT): Benjamin Stainhekkekandam   Encounter Date: 12/12/2019  PT End of Session - 12/12/19 0850    Visit Number  2    Number of Visits  12    Date for PT Re-Evaluation  01/21/20    PT Start Time  0850    PT Stop Time  0927    PT Time Calculation (min)  37 min    Activity Tolerance  Patient tolerated treatment well;No increased pain       Past Medical History:  Diagnosis Date  . Abnormal Pap smear of vagina   . Neck pain   . Shoulder pain     Past Surgical History:  Procedure Laterality Date  . CESAREAN SECTION      There were no vitals filed for this visit.  Subjective Assessment - 12/12/19 0853    Subjective  Pt reports her Lt (posterior) shoulder feels a little looser since DN, but the front side is now bothering her. She has been completing HEP 2x/day.    Currently in Pain?  No/denies    Pain Score  0-No pain   just tightness   Pain Location  Shoulder    Pain Orientation  Left         [facility] PT Assessment - 12/12/19 0001      Assessment   Medical Diagnosis  Cervical DDD, Left RC dysfunction    Referring Provider (PT)  Thekkekandam    Onset Date/Surgical Date  05/13/19    Next MD Visit  after PT       [facility] - 12/12/19 0001      Self-Care   Self-Care  Other Self-Care Comments    Other Self-Care Comments   Pt educated on self massage to Lt pec and periscapular muscles with use of ball; pt returned demo with cues.       Exercises   Exercises  Shoulder      Shoulder Exercises: Supine   External Rotation  AAROM;Left;5 reps   to tolerance   Flexion  AAROM;Both;5 reps   cane      Shoulder Exercises: Seated   Retraction  Strengthening;Both   3 reps x 5 sec, per  HEP     Shoulder Exercises: ROM/Strengthening   UBE (Upper Arm Bike)  L2: 1 min forward, 1 min backward, standing      Shoulder Exercises: Stretch   Star Gazer Stretch  3 reps;10 seconds   with elbow press    Other Shoulder Stretches  low pec stretch per HEP x 30 sec x 2 reps    Other Shoulder Stretches  midlevel doorway stretch x 15 sec x 2 reps (limited tolerance); high level door stretch, unilateral x 20 sec x 2 reps each arm.   bilat bicep stretch holding door frame x 20 sec x 2 reps      Manual Therapy   Soft tissue mobilization  IASTM to Lt pec, upper trap and posterior shoulder to decrease fascial restrictions       Neck Exercises: Stretches   Upper Trapezius Stretch  Right;2 reps;10 seconds    Levator Stretch  Right;2 reps;10 seconds             PT Education - 12/12/19 1300    Education Details  HEP (  added stretch); posture (discussed work station set up)    [employer]) Educated  Patient    Methods  Explanation;Demonstration;Verbal Media planner not available.   Comprehension  Verbalized understanding;Returned demonstration       PT Short Term Goals - 12/10/19 1401      PT SHORT TERM GOAL #1   Title  Independent with HEP for strengthening and flexibility to increase function.    Time  6    Period  Weeks    Status  New    Target Date  01/21/20      PT SHORT TERM GOAL #2   Title  Patient able to perform OH ADLs without pain in the left shoulder or neck.    Time  6    Period  Weeks    Status  New      PT SHORT TERM GOAL #3   Title  Patient to report no pain with full weightbearing through her LUE    Time  6    Period  Weeks    Status  New      PT SHORT TERM GOAL #4   Title  Pt to demo 5/5 left shoulder strength to normalize ADLS.    Time  6    Period  Weeks    Status  New      PT SHORT TERM GOAL #5   Title  Pt to report decreased pain in the neck by 50% or more with ADLS.    Time  6    Period  Weeks    Status  New        PT Long Term Goals -  12/10/19 1404      PT LONG TERM GOAL #1   Title  SEE STGs (LTG=STG)            Plan - 12/12/19 0858    Clinical Impression Statement  Pain of 3/10 reported with UBE use; resolved with stretches and IASTM. Encouraged pt to modify workstation and focus on posture.  Pt tolerated doorway stretch with elbow straight bettern than elbow bent.  Will add additional shoulder ROM/ postural strengthening exercises in future sessions.  Goals are ongoing.    Rehab Potential  Excellent    PT Frequency  2x / week    PT Duration  6 weeks    PT Treatment/Interventions  ADLs/Self Care Home Management;Cryotherapy;Electrical Stimulation;Moist Heat;Ultrasound;Therapeutic exercise;Neuromuscular re-education;Manual techniques;Patient/family education;Dry needling;Passive range of motion;Taping;Joint Manipulations;Spinal Manipulations    PT Next Visit Plan  shoulder and scapular strengthening, shoulder ROM; sulfa allergy (no ionto)    PT Home Exercise Plan  [id number]    Consulted and Agree with Plan of Care  Patient       Patient will benefit from skilled therapeutic intervention in order to improve the following deficits and impairments:     Visit Diagnosis: Cervicalgia  Acute pain of left shoulder  Stiffness of left shoulder, not elsewhere classified  Muscle weakness (generalized)     Problem List Patient Active Problem List   Diagnosis Date Noted  . Impingement syndrome, shoulder, left 11/27/2019  . DDD (degenerative disc disease), cervical 11/27/2019  . Bilateral impacted cerumen 10/31/2018  . Elevated blood pressure reading 10/31/2018  . Serum potassium elevated 07/13/2018  . Leukopenia 07/13/2018  . Muscle tightness 07/13/2018  . Neck pain 07/13/2018  . Primary osteoarthritis of left knee 06/04/2018   Mayer Camel, PTA 12/12/19 1:08 PM  [facility] [address] [place],  [place], [address] Phone: [phone]   Fax:   [phone]  Name: Casey Castaneda MRN: [id number] Date of Birth: July 17, 1956

## 2019-12-17 ENCOUNTER — Encounter: Payer: Self-pay | Admitting: Physical Therapy

## 2019-12-17 ENCOUNTER — Ambulatory Visit (INDEPENDENT_AMBULATORY_CARE_PROVIDER_SITE_OTHER): Payer: 59 | Admitting: Physical Therapy

## 2019-12-17 ENCOUNTER — Other Ambulatory Visit: Payer: Self-pay

## 2019-12-17 DIAGNOSIS — M542 Cervicalgia: Secondary | ICD-10-CM | POA: Diagnosis not present

## 2019-12-17 DIAGNOSIS — M6281 Muscle weakness (generalized): Secondary | ICD-10-CM

## 2019-12-17 DIAGNOSIS — M25612 Stiffness of left shoulder, not elsewhere classified: Secondary | ICD-10-CM

## 2019-12-17 DIAGNOSIS — M25512 Pain in left shoulder: Secondary | ICD-10-CM

## 2019-12-17 NOTE — Therapy (Signed)
[facility] [place] [address] [place], [place], [address] Phone: [phone]   Fax:  [phone]  Physical Therapy Treatment  Patient Details  Name: Casey Castaneda MRN: [id number] Date of Birth: [birth year] Referring Provider (PT): Thekkekandam   Encounter Date: 12/17/2019  PT End of Session - 12/17/19 0855    Visit Number  3    Number of Visits  12    Date for PT Re-Evaluation  01/21/20    PT Start Time  0852   pt arrived late   PT Stop Time  0931    PT Time Calculation (min)  39 min    Activity Tolerance  Patient tolerated treatment well;No increased pain    Behavior During Therapy  WFL for tasks assessed/performed       Past Medical History:  Diagnosis Date  . Abnormal Pap smear of vagina   . Neck pain   . Shoulder pain     Past Surgical History:  Procedure Laterality Date  . CESAREAN SECTION      There were no vitals filed for this visit.  Subjective Assessment - 12/17/19 0856    Subjective  Pt had one or two "good days" since last visit, but pain has returned.  She is still tweaking work station set up.    Patient Stated Goals  get ROM back without pain and gain strength to lift grandbabies    Currently in Pain?  Yes    Pain Score  3     Pain Location  Shoulder    Pain Orientation  Left;Anterior;Posterior    Pain Descriptors / Indicators  Dull;Aching    Aggravating Factors   lifting overhead, pushing through arm    Pain Relieving Factors  rest, heat, massage         [facility] PT Assessment - 12/17/19 0001      Assessment   Medical Diagnosis  Cervical DDD, Left RC dysfunction    Referring Provider (PT)  Thekkekandam    Onset Date/Surgical Date  05/13/19    Next MD Visit  after PT       [facility] Adult PT Treatment/Exercise - 12/17/19 0001      Shoulder Exercises: Standing   Row  Both;10 reps;Strengthening    Theraband Level (Shoulder Row)  Level 1 (Yellow)    Retraction  Both;5 reps   5 sec hold against  pool noodle   Other Standing Exercises  L's x 10 reps, W's x 10 reps      Shoulder Exercises: ROM/Strengthening   UBE (Upper Arm Bike)  L2: 30 sec forward, 1 min backward, standing - improved tolerance     Shoulder Exercises: Stretch   Other Shoulder Stretches  low, middle and high doorway stretch x 15-20 sec x 2 reps each (improved tolerance)   Other Shoulder Stretches  seated thoracic ext over back of chair x 5 sec hold x 5 reps.  Shoulder rolls x 10 reps forward/backward      Manual Therapy   Manual Therapy  Soft tissue mobilization;Myofascial release    Soft tissue mobilization  STM to Lt periscapular musculature including levator, rhomboid, mid trap (including TPR); STM to Lt pec, platysma, and scalene    Myofascial Release  Lt scalenes, Lt pec maj/minor               PT Short Term Goals - 12/10/19 1401      PT SHORT TERM GOAL #1   Title  Independent with HEP for strengthening and  flexibility to increase function.    Time  6    Period  Weeks    Status  New    Target Date  01/21/20      PT SHORT TERM GOAL #2   Title  Patient able to perform OH ADLs without pain in the left shoulder or neck.    Time  6    Period  Weeks    Status  New      PT SHORT TERM GOAL #3   Title  Patient to report no pain with full weightbearing through her LUE    Time  6    Period  Weeks    Status  New      PT SHORT TERM GOAL #4   Title  Pt to demo 5/5 left shoulder strength to normalize ADLS.    Time  6    Period  Weeks    Status  New      PT SHORT TERM GOAL #5   Title  Pt to report decreased pain in the neck by 50% or more with ADLS.    Time  6    Period  Weeks    Status  New        PT Long Term Goals - 12/10/19 1404      PT LONG TERM GOAL #1   Title  SEE STGs (LTG=STG)            Plan - 12/17/19 0932    Clinical Impression Statement  Pt had positive response to last session and today's exercises. Pt reported reduction of pain with IR stretch and scap retraction  exercises.  Continued palpable tightness in Lt pec minor/maj and levator; may benefit from another session of DN in future.  Pt making gradual gains towards goals.    Rehab Potential  Excellent    PT Frequency  2x / week    PT Duration  6 weeks    PT Treatment/Interventions  ADLs/Self Care Home Management;Cryotherapy;Electrical Stimulation;Moist Heat;Ultrasound;Therapeutic exercise;Neuromuscular re-education;Manual techniques;Patient/family education;Dry needling;Passive range of motion;Taping;Joint Manipulations;Spinal Manipulations    PT Next Visit Plan  shoulder and scapular strengthening, shoulder ROM; sulfa allergy (no ionto)    PT Home Exercise Plan  [email or web address]    Consulted and Agree with Plan of Care  Patient       Patient will benefit from skilled therapeutic intervention in order to improve the following deficits and impairments:     Visit Diagnosis: Cervicalgia  Acute pain of left shoulder  Stiffness of left shoulder, not elsewhere classified  Muscle weakness (generalized)     Problem List Patient Active Problem List   Diagnosis Date Noted  . Impingement syndrome, shoulder, left 11/27/2019  . DDD (degenerative disc disease), cervical 11/27/2019  . Bilateral impacted cerumen 10/31/2018  . Elevated blood pressure reading 10/31/2018  . Serum potassium elevated 07/13/2018  . Leukopenia 07/13/2018  . Muscle tightness 07/13/2018  . Neck pain 07/13/2018  . Primary osteoarthritis of left knee 06/04/2018   Kerin Perna, PTA 12/17/19 1:08 PM   [facility] [place], [place], [address] Phone: [phone]   Fax:  [phone]  Name: Casey Castaneda MRN: [id number] Date of Birth: October 05, 1956

## 2019-12-17 NOTE — Patient Instructions (Signed)
Access Code: [email or web address]  URL: [email or web address]  Date: 12/17/2019  Prepared by: Mayer Camel   Exercises  Doorway Pec Stretch at 60 Degrees Abduction with Arm Straight - 3 reps - 1 sets - 30-60 sec hold - 2x daily - 7x weekly  Seated Levator Scapulae Stretch - 3 reps - 15 hold - 2x daily - 7x weekly  Seated Upper Trapezius Stretch - 3 reps - 15 hold - 2x daily - 7x weekly  Seated Scapular Retraction - 10 reps - 1-33 sets - 2-3 sec hold - 2x daily - 7x weekly  Doorway Pec Stretch at 120 Elevation with Arm Straight - 3 reps - 1 sets - 15-20 hold - 2x daily - 7x weekly  Standing Shoulder Row with Anchored Resistance - 10 reps - 1-2 sets - 1x daily - 7x weekly  Standing Scapular Retraction - 5 reps - 1 sets - 5 hold - 2-3x daily - 7x weekly  Seated Shoulder W - 5 reps - 1 sets - 5-10 hold - 2-3x daily - 7x weekly  Seated Thoracic Lumbar Extension with Pectoralis Stretch - 2-3 reps - 1 sets - 5-10 hold - 1x daily - 7x weekly

## 2019-12-19 ENCOUNTER — Ambulatory Visit (INDEPENDENT_AMBULATORY_CARE_PROVIDER_SITE_OTHER): Payer: 59 | Admitting: Physical Therapy

## 2019-12-19 ENCOUNTER — Encounter: Payer: Self-pay | Admitting: Physical Therapy

## 2019-12-19 ENCOUNTER — Other Ambulatory Visit: Payer: Self-pay

## 2019-12-19 DIAGNOSIS — M6281 Muscle weakness (generalized): Secondary | ICD-10-CM | POA: Diagnosis not present

## 2019-12-19 DIAGNOSIS — M25512 Pain in left shoulder: Secondary | ICD-10-CM

## 2019-12-19 DIAGNOSIS — M25612 Stiffness of left shoulder, not elsewhere classified: Secondary | ICD-10-CM | POA: Diagnosis not present

## 2019-12-19 DIAGNOSIS — M542 Cervicalgia: Secondary | ICD-10-CM | POA: Diagnosis not present

## 2019-12-19 NOTE — Patient Instructions (Signed)
Access Code: [email or web address]  URL: [email or web address]  Date: 12/19/2019  Prepared by: [facility] Palm   Exercises Doorway Pec Stretch at 60 Degrees Abduction with Arm Straight - 3 reps - 1 sets - 30-60 sec hold - 2x daily - 7x weekly Seated Levator Scapulae Stretch - 3 reps - 15 hold - 2x daily - 7x weekly Seated Upper Trapezius Stretch - 3 reps - 15 hold - 2x daily - 7x weekly Seated Scapular Retraction - 10 reps - 1-33 sets - 2-3 sec hold - 2x daily - 7x weekly Doorway Pec Stretch at 120 Elevation with Arm Straight - 3 reps - 1 sets - 15-20 hold - 2x daily - 7x weekly Standing Shoulder Row with Anchored Resistance - 10 reps - 1-2 sets - 1x daily - 7x weekly Standing Scapular Retraction - 5 reps - 1 sets - 5 hold - 2-3x daily - 7x weekly Seated Shoulder W - 5 reps - 1 sets - 5-10 hold - 2-3x daily - 7x weekly Seated Thoracic Lumbar Extension with Pectoralis Stretch - 2-3 reps - 1 sets - 5-10 hold - 1x daily - 7x weekly Thoracic Extension Mobilization on Foam Roll - 5 reps - 2 sets - 1x daily - 7x weekly

## 2019-12-19 NOTE — Therapy (Signed)
[place] [address] [place], [place], [address] Phone: [phone]   Fax:  [phone]  Physical Therapy Treatment  Patient Details  Name: Casey Castaneda MRN: [id number] Date of Birth: 1956/03/29 Referring Provider (PT): Thekkekandam   Encounter Date: 12/19/2019  PT End of Session - 12/19/19 1019    Visit Number  4    Number of Visits  12    Date for PT Re-Evaluation  01/21/20    PT Start Time  1019    PT Stop Time  1104    PT Time Calculation (min)  45 min    Activity Tolerance  Patient tolerated treatment well;No increased pain    Behavior During Therapy  WFL for tasks assessed/performed       Past Medical History:  Diagnosis Date  . Abnormal Pap smear of vagina   . Neck pain   . Shoulder pain     Past Surgical History:  Procedure Laterality Date  . CESAREAN SECTION      There were no vitals filed for this visit.  Subjective Assessment - 12/19/19 1020    Subjective  Patient feels a little better. Had 3-4 days relief originally after DN.    Diagnostic tests  xrays - degenerative changes    Patient Stated Goals  get ROM back without pain and gain strength to lift grandbabies    Currently in Pain?  Yes    Pain Score  3     Pain Location  Shoulder    Pain Orientation  Left;Anterior;Posterior    Pain Descriptors / Indicators  Aching;Dull    Pain Type  Chronic pain                       [facility] Adult PT Treatment/Exercise - 12/19/19 0001      Shoulder Exercises: Supine   Other Supine Exercises  thoracic extension over 1/2 foam 2x30 sed      Shoulder Exercises: Standing   Row  Both;15 reps;Theraband    Theraband Level (Shoulder Row)  Level 1 (Yellow)    Retraction  Both;10 reps   5 sec hold against pool noodle   Other Standing Exercises  L's x 10 reps, W's x 10 reps      Shoulder Exercises: Stretch   Other Shoulder Stretches  on noodle; pec stretch 1 x 1 min      Manual Therapy   Manual Therapy  Soft tissue mobilization;Scapular mobilization;Joint mobilization    Manual therapy comments  skilled palpation and monitoring of soft tissue during DN     Joint Mobilization  Thoracic PA T5/6/7 area gd II/III   painful at left T7   Soft tissue mobilization  to lower traps, medial and lateral scapula    Scapular Mobilization  in [facility] all planes       Trigger Point Dry Needling - 12/19/19 0001    Consent Given?  Yes    Education Handout Provided  Previously provided    Muscles Treated Upper Quadrant  Latissimus dorsi;Teres major;Lower trapezius    Latissimus dorsi Response  Twitch response elicited;Palpable increased muscle length    Teres major Response  Twitch response elicited;Palpable increased muscle length    Lower trapezius Response  Twitch response elicited;Palpable increased muscle length             PT Short Term Goals - 12/10/19 1401      PT SHORT TERM GOAL #1   Title  Independent with HEP  for strengthening and flexibility to increase function.    Time  6    Period  Weeks    Status  New    Target Date  01/21/20      PT SHORT TERM GOAL #2   Title  Patient able to perform OH ADLs without pain in the left shoulder or neck.    Time  6    Period  Weeks    Status  New      PT SHORT TERM GOAL #3   Title  Patient to report no pain with full weightbearing through her LUE    Time  6    Period  Weeks    Status  New      PT SHORT TERM GOAL #4   Title  Pt to demo 5/5 left shoulder strength to normalize ADLS.    Time  6    Period  Weeks    Status  New      PT SHORT TERM GOAL #5   Title  Pt to report decreased pain in the neck by 50% or more with ADLS.    Time  6    Period  Weeks    Status  New        PT Long Term Goals - 12/10/19 1404      PT LONG TERM GOAL #1   Title  SEE STGs (LTG=STG)            Plan - 12/19/19 1335    Clinical Impression Statement  Patient had decreased tissue tension in left pectorals after last session but  continued to have TPs in left low traps and lateral scapular muscles. She had a good response to DN in these areas. She has pain with PA mobs at T7 and discomfort with supine thoracic ext over bolster which was given for HEP.    PT Treatment/Interventions  ADLs/Self Care Home Management;Cryotherapy;Electrical Stimulation;Moist Heat;Ultrasound;Therapeutic exercise;Neuromuscular re-education;Manual techniques;Patient/family education;Dry needling;Passive range of motion;Taping;Joint Manipulations;Spinal Manipulations    PT Next Visit Plan  shoulder and scapular strengthening, shoulder ROM; sulfa allergy (no ionto)    PT Home Exercise Plan  [email or web address]    Consulted and Agree with Plan of Care  Patient       Patient will benefit from skilled therapeutic intervention in order to improve the following deficits and impairments:  Decreased range of motion, Pain, Decreased strength, Impaired flexibility, Impaired UE functional use  Visit Diagnosis: Acute pain of left shoulder  Cervicalgia  Stiffness of left shoulder, not elsewhere classified  Muscle weakness (generalized)     Problem List Patient Active Problem List   Diagnosis Date Noted  . Impingement syndrome, shoulder, left 11/27/2019  . DDD (degenerative disc disease), cervical 11/27/2019  . Bilateral impacted cerumen 10/31/2018  . Elevated blood pressure reading 10/31/2018  . Serum potassium elevated 07/13/2018  . Leukopenia 07/13/2018  . Muscle tightness 07/13/2018  . Neck pain 07/13/2018  . Primary osteoarthritis of left knee 06/04/2018   [facility] Palm PT 12/19/2019, 1:38 PM  [facility] [address] [place], [place], [address] Phone: [phone]   Fax:  [phone]  Name: Casey Castaneda MRN: [id number] Date of Birth: [birth year]

## 2019-12-24 ENCOUNTER — Other Ambulatory Visit: Payer: Self-pay

## 2019-12-24 ENCOUNTER — Ambulatory Visit (INDEPENDENT_AMBULATORY_CARE_PROVIDER_SITE_OTHER): Payer: 59 | Admitting: Physical Therapy

## 2019-12-24 DIAGNOSIS — M25612 Stiffness of left shoulder, not elsewhere classified: Secondary | ICD-10-CM | POA: Diagnosis not present

## 2019-12-24 DIAGNOSIS — M542 Cervicalgia: Secondary | ICD-10-CM | POA: Diagnosis not present

## 2019-12-24 DIAGNOSIS — M6281 Muscle weakness (generalized): Secondary | ICD-10-CM | POA: Diagnosis not present

## 2019-12-24 DIAGNOSIS — M25512 Pain in left shoulder: Secondary | ICD-10-CM | POA: Diagnosis not present

## 2019-12-24 NOTE — Therapy (Signed)
[place]  [place], [place], [address] Phone: [phone]   Fax:  [phone]  Physical Therapy Treatment  Patient Details  Name: Casey Castaneda MRN: [id number] Date of Birth: Feb 15, 1956 Referring Provider (PT): Thekkekandam   Encounter Date: 12/24/2019  PT End of Session - 12/24/19 1022    Visit Number  5    Number of Visits  12    Date for PT Re-Evaluation  01/21/20    PT Start Time  1022    PT Stop Time  2536    PT Time Calculation (min)  43 min    Activity Tolerance  Patient tolerated treatment well;No increased pain    Behavior During Therapy  WFL for tasks assessed/performed       Past Medical History:  Diagnosis Date  . Abnormal Pap smear of vagina   . Neck pain   . Shoulder pain     Past Surgical History:  Procedure Laterality Date  . CESAREAN SECTION      There were no vitals filed for this visit.  Subjective Assessment - 12/24/19 1022    Subjective  I feel like I'm not nearly as tight and am more aware of posture.    Pertinent History  arthritis, neck pain    Diagnostic tests  xrays - degenerative changes    Patient Stated Goals  get ROM back without pain and gain strength to lift grandbabies    Currently in Pain?  Yes    Pain Score  3     Pain Location  Shoulder    Pain Orientation  Left;Anterior;Posterior    Pain Descriptors / Indicators  Aching;Dull                       [facility] Adult PT Treatment/Exercise - 12/24/19 0001      Shoulder Exercises: Supine   Protraction  Left;15 reps    Protraction Weight (lbs)  4      Shoulder Exercises: Standing   External Rotation  Left;15 reps;Theraband    Theraband Level (Shoulder External Rotation)  Level 1 (Yellow)    Internal Rotation Limitations  pain    Flexion  Left;15 reps;Theraband    Theraband Level (Shoulder Flexion)  Level 1 (Yellow)    ABduction Limitations  pain    Extension  Left;15 reps;Theraband    Theraband Level (Shoulder Extension)  Level 1 (Yellow)    Other Standing Exercises   L's too painful today W's x 10 reps      Shoulder Exercises: Isometric Strengthening   Internal Rotation  5X10"    ABduction  5X10"      Shoulder Exercises: Stretch   Other Shoulder Stretches  door stretch Lt 80 deg 2x30 sec      Manual Therapy   Manual Therapy  Soft tissue mobilization;Joint mobilization;Other (comment)    Manual therapy comments  skilled palpation and monitoring of soft tissue during DN     Joint Mobilization  to thoracic spine gd II/III    Soft tissue mobilization  to left UT and cervical paraspinals    Other Manual Therapy  PNF D2 flex/ext 2x10 with light resistance 2nd set; rhythmic stab in 90 deg flex       Trigger Point Dry Needling - 12/24/19 0001    Consent Given?  Yes    Education Handout Provided  Previously provided    Muscles Treated Head and Neck  Upper trapezius;Cervical multifidi    Dry Needling Comments  left    Upper Trapezius Response  Twitch reponse elicited;Palpable increased muscle length    Cervical multifidi Response  Twitch reponse elicited;Palpable increased muscle length             PT Short Term Goals - 12/10/19 1401      PT SHORT TERM GOAL #1   Title  Independent with HEP for strengthening and flexibility to increase function.    Time  6    Period  Weeks    Status  New    Target Date  01/21/20      PT SHORT TERM GOAL #2   Title  Patient able to perform OH ADLs without pain in the left shoulder or neck.    Time  6    Period  Weeks    Status  New      PT SHORT TERM GOAL #3   Title  Patient to report no pain with full weightbearing through her LUE    Time  6    Period  Weeks    Status  New      PT SHORT TERM GOAL #4   Title  Pt to demo 5/5 left shoulder strength to normalize ADLS.    Time  6    Period  Weeks    Status  New      PT SHORT TERM GOAL #5   Title  Pt to report decreased pain in the neck by 50% or more with ADLS.     Time  6    Period  Weeks    Status  New        PT Long Term Goals - 12/10/19 1404      PT LONG TERM GOAL #1   Title  SEE STGs (LTG=STG)            Plan - 12/24/19 1507    Clinical Impression Statement  Patient reporting improvements but pain hasn't changed. She reports the IR stretch is increasing her pain so advised to hold at this time. She had pain with resisted ABD and IR so isometrics issued. Good tolerance to ER, Flex and ext with band. Great response to DN in cervical and left UT.    PT Treatment/Interventions  ADLs/Self Care Home Management;Cryotherapy;Electrical Stimulation;Moist Heat;Ultrasound;Therapeutic exercise;Neuromuscular re-education;Manual techniques;Patient/family education;Dry needling;Passive range of motion;Taping;Joint Manipulations;Spinal Manipulations    PT Next Visit Plan  check goals, Continue with shoulder and scapular strengthening as tolerated, Add prone TE, check shoulder ROM; sulfa allergy (no ionto)    PT Home Exercise Plan  [email or web address]       Patient will benefit from skilled therapeutic intervention in order to improve the following deficits and impairments:  Decreased range of motion, Pain, Decreased strength, Impaired flexibility, Impaired UE functional use  Visit Diagnosis: Acute pain of left shoulder  Cervicalgia  Stiffness of left shoulder, not elsewhere classified  Muscle weakness (generalized)     Problem List Patient Active Problem List   Diagnosis Date Noted  . Impingement syndrome, shoulder, left 11/27/2019  . DDD (degenerative disc disease), cervical 11/27/2019  . Bilateral impacted cerumen 10/31/2018  . Elevated blood pressure reading 10/31/2018  . Serum potassium elevated 07/13/2018  . Leukopenia 07/13/2018  . Muscle tightness 07/13/2018  . Neck pain 07/13/2018  . Primary osteoarthritis of left knee 06/04/2018    [facility] Palm PT 12/24/2019, 3:13 PM  [facility] [address] [address] [place], [place], [address] Phone: [phone]   Fax:  [phone]  Name:  Nakaiya Beddow MRN: [id number] Date of Birth: 1956-09-28

## 2019-12-24 NOTE — Patient Instructions (Signed)
Access Code: [email or web address]  URL: [email or web address]  Date: 12/24/2019  Prepared by: [facility] Palm   Exercises Doorway Pec Stretch at 60 Degrees Abduction with Arm Straight - 3 reps - 1 sets - 30-60 sec hold - 2x daily - 7x weekly Seated Levator Scapulae Stretch - 3 reps - 15 hold - 2x daily - 7x weekly Seated Upper Trapezius Stretch - 3 reps - 15 hold - 2x daily - 7x weekly Seated Scapular Retraction - 10 reps - 1-33 sets - 2-3 sec hold - 2x daily - 7x weekly Doorway Pec Stretch at 120 Elevation with Arm Straight - 3 reps - 1 sets - 15-20 hold - 2x daily - 7x weekly Standing Shoulder Row with Anchored Resistance - 10 reps - 1-2 sets - 1x daily - 7x weekly Standing Scapular Retraction - 5 reps - 1 sets - 5 hold - 2-3x daily - 7x weekly Seated Shoulder W - 5 reps - 1 sets - 5-10 hold - 2-3x daily - 7x weekly Seated Thoracic Lumbar Extension with Pectoralis Stretch - 2-3 reps - 1 sets - 5-10 hold - 1x daily - 7x weekly Thoracic Extension Mobilization on Foam Roll - 5 reps - 2 sets - 1x daily - 7x weekly Standing Isometric Shoulder Internal Rotation with Towel Roll at Doorway - 5 reps - 1 sets - 10 sec hold hold - 1x daily - 7x weekly Standing Isometric Shoulder Abduction with Doorway - 5 reps - 1 sets - 10 sec hold - 1x daily - 7x weekly Standing Shoulder External Rotation with Resistance - 10 reps - 2 sets - 1x daily - 7x weekly

## 2019-12-26 ENCOUNTER — Other Ambulatory Visit: Payer: Self-pay

## 2019-12-26 ENCOUNTER — Ambulatory Visit (INDEPENDENT_AMBULATORY_CARE_PROVIDER_SITE_OTHER): Payer: 59 | Admitting: Physical Therapy

## 2019-12-26 DIAGNOSIS — M25612 Stiffness of left shoulder, not elsewhere classified: Secondary | ICD-10-CM

## 2019-12-26 DIAGNOSIS — M25512 Pain in left shoulder: Secondary | ICD-10-CM

## 2019-12-26 DIAGNOSIS — M542 Cervicalgia: Secondary | ICD-10-CM

## 2019-12-26 DIAGNOSIS — M6281 Muscle weakness (generalized): Secondary | ICD-10-CM | POA: Diagnosis not present

## 2019-12-26 NOTE — Patient Instructions (Signed)

## 2019-12-26 NOTE — Therapy (Signed)
[place], [place], [address] Phone: [phone]   Fax:  [phone]  Physical Therapy Treatment  Patient Details  Name: Casey Castaneda MRN: [id number] Date of Birth: Oct 22, 1956 Referring Provider (PT): Thekkekandam   Encounter Date: 12/26/2019  PT End of Session - 12/26/19 1021    Visit Number  6    Number of Visits  12    Date for PT Re-Evaluation  01/21/20    PT Start Time  2094    PT Stop Time  7096    PT Time Calculation (min)  39 min       Past Medical History:  Diagnosis Date  . Abnormal Pap smear of vagina   . Neck pain   . Shoulder pain     Past Surgical History:  Procedure Laterality Date  . CESAREAN SECTION      There were no vitals filed for this visit.  Subjective Assessment - 12/26/19 1030    Subjective  Pt reports less tightness in posterior Lt shoulder and neck, but continued pain in Lt shoulder with overhead movement.  It is especially painful when returning arm down from overhead position.    Currently in Pain?  Yes    Pain Score  2     Pain Location  Shoulder    Pain Orientation  Left;Anterior    Pain Descriptors / Indicators  Sore    Aggravating Factors   overhead movement (flexion/ abdct)    Pain Relieving Factors  massage, rest         [facility] PT Assessment - 12/26/19 0001      Assessment   Medical Diagnosis  Cervical DDD, Left RC dysfunction    Referring Provider (PT)  Thekkekandam    Onset Date/Surgical Date  05/13/19    Next MD Visit  after PT      [facility] Adult PT Treatment/Exercise - 12/26/19 0001      Shoulder Exercises: Supine   Flexion  AROM;Left;5 reps   after manual therapy     Shoulder Exercises: Prone   Retraction  Left;5 reps    Flexion  Left;10 reps;Strengthening    Extension  Strengthening;Left;5 reps    Extension Limitations  painful on return to neutral (in bicep tendon area)    External Rotation  Strengthening;Left;10 reps   W arm  position   Horizontal ABduction 1  Strengthening;Left;10 reps   tactlie cues for scapula positioning   Horizontal ABduction 2  Left;10 reps;Strengthening    Other Prone Exercises  prone Lt row with scap retraction, x 10 reps;        Shoulder Exercises: Stretch   Other Shoulder Stretches  door stretch @ 120 deg x 20 sec      Manual Therapy   Manual Therapy  Passive ROM;Soft tissue mobilization;Myofascial release;Joint mobilization    Joint Mobilization  Lt shoulder PA GH mobs grade II. Rib springing at sternum (point tender on Lt)    Soft tissue mobilization  STM to Lt pec, ant Lt shoulder     Myofascial Release  Lt pec major    Passive ROM  attempts at Lt shoulder horiz abdct; painful/ guarded, stopped.     Other Manual Therapy  I strips of regular Rock tape applied in X pattern with 15% stretch to Lt ant shoulder (prox bicep tendon) to decompress tissue, increase proprioception and decrease pain.       Neck Exercises: Stretches   Upper Trapezius Stretch  Right;2 reps;10 seconds  Levator Stretch  Right;1 rep;20 seconds             PT Education - 12/26/19 1119    Education Details  ktape info    Person(s) Educated  Patient    Methods  Explanation;Handout    Comprehension  Verbalized understanding       PT Short Term Goals - 12/26/19 1104      PT SHORT TERM GOAL #1   Title  Independent with HEP for strengthening and flexibility to increase function.    Time  6    Period  Weeks    Status  On-going    Target Date  01/21/20      PT SHORT TERM GOAL #2   Title  Patient able to perform OH ADLs without pain in the left shoulder or neck.    Baseline  Pt reports pain in ant Lt shoulder with overhead motions, not neck: 12/26/19    Time  6    Period  Weeks    Status  Partially Met      PT SHORT TERM GOAL #3   Title  Patient to report no pain with full weightbearing through her LUE    Time  6    Period  Weeks    Status  On-going      PT SHORT TERM GOAL #4   Title  Pt to  demo 5/5 left shoulder strength to normalize ADLS.    Time  6    Period  Weeks    Status  On-going      PT SHORT TERM GOAL #5   Title  Pt to report decreased pain in the neck by 50% or more with ADLS.    Time  6    Period  Weeks    Status  Achieved        PT Long Term Goals - 12/10/19 1404      PT LONG TERM GOAL #1   Title  SEE STGs (LTG=STG)            Plan - 12/26/19 1105    Clinical Impression Statement  Pt no longer has neck pain with OH ADLs.  Lt shoulder ROM remains functional, but pt reports persistant pain in ant Lt shoulder with flexion and abdct. Also unable to tolerate passive horiz abdct in supine, arm off of table due to increased pain in posterior shoulder.  She tolerated prone shoulder exercises (without resistance) without increase in pain. She was point tender with STM to pec at sternal attachment today.  Pt has partially met LTG#2 and met #5.    Rehab Potential  Excellent    PT Frequency  2x / week    PT Duration  6 weeks    PT Treatment/Interventions  ADLs/Self Care Home Management;Cryotherapy;Electrical Stimulation;Moist Heat;Ultrasound;Therapeutic exercise;Neuromuscular re-education;Manual techniques;Patient/family education;Dry needling;Passive range of motion;Taping;Joint Manipulations;Spinal Manipulations    PT Next Visit Plan  check goals, Continue with shoulder and scapular strengthening as tolerated, Add prone TE, check shoulder ROM; sulfa allergy (no ionto)    PT Home Exercise Plan  [email or web address]       Patient will benefit from skilled therapeutic intervention in order to improve the following deficits and impairments:  Decreased range of motion, Pain, Decreased strength, Impaired flexibility, Impaired UE functional use  Visit Diagnosis: Acute pain of left shoulder  Cervicalgia  Stiffness of left shoulder, not elsewhere classified  Muscle weakness (generalized)     Problem List Patient Active Problem List   Diagnosis Date Noted  .  Impingement syndrome, shoulder, left 11/27/2019  . DDD (degenerative disc disease), cervical 11/27/2019  . Bilateral impacted cerumen 10/31/2018  . Elevated blood pressure reading 10/31/2018  . Serum potassium elevated 07/13/2018  . Leukopenia 07/13/2018  . Muscle tightness 07/13/2018  . Neck pain 07/13/2018  . Primary osteoarthritis of left knee 06/04/2018   Kerin Perna, PTA 12/26/19 11:20 AM  [place], [place], [address] Phone: [phone]   Fax:  [phone]  Name: Jamey Demchak MRN: [id number] Date of Birth: 1956/03/02

## 2019-12-31 ENCOUNTER — Other Ambulatory Visit: Payer: Self-pay

## 2019-12-31 ENCOUNTER — Encounter: Payer: Self-pay | Admitting: Physical Therapy

## 2019-12-31 ENCOUNTER — Ambulatory Visit (INDEPENDENT_AMBULATORY_CARE_PROVIDER_SITE_OTHER): Payer: 59 | Admitting: Physical Therapy

## 2019-12-31 DIAGNOSIS — M6281 Muscle weakness (generalized): Secondary | ICD-10-CM

## 2019-12-31 DIAGNOSIS — M25612 Stiffness of left shoulder, not elsewhere classified: Secondary | ICD-10-CM

## 2019-12-31 DIAGNOSIS — M542 Cervicalgia: Secondary | ICD-10-CM

## 2019-12-31 DIAGNOSIS — M25512 Pain in left shoulder: Secondary | ICD-10-CM

## 2019-12-31 NOTE — Therapy (Signed)
[facility] [place] [address] [place], [place], [address] Phone: [phone]   Fax:  [phone]  Physical Therapy Treatment  Patient Details  Name: Casey Castaneda MRN: [id number] Date of Birth: 04-Feb-1956 Referring Provider (PT): Thekkekandam   Encounter Date: 12/31/2019  PT End of Session - 12/31/19 1019    Visit Number  7    Number of Visits  12    Date for PT Re-Evaluation  01/21/20    PT Start Time  1019    PT Stop Time  1104    PT Time Calculation (min)  45 min    Activity Tolerance  Patient tolerated treatment well;No increased pain    Behavior During Therapy  WFL for tasks assessed/performed       Past Medical History:  Diagnosis Date  . Abnormal Pap smear of vagina   . Neck pain   . Shoulder pain     Past Surgical History:  Procedure Laterality Date  . CESAREAN SECTION      There were no vitals filed for this visit.  Subjective Assessment - 12/31/19 1019    Subjective  Less pain with adduction this weekend with sweeping floor.    Patient Stated Goals  get ROM back without pain and gain strength to lift grandbabies    Currently in Pain?  No/denies         [facility] PT Assessment - 12/31/19 0001      AROM   Overall AROM Comments  flex 145/160 in standing no pain      Strength   Strength Assessment Site  Shoulder    Right/Left Shoulder  Left    Left Shoulder Flexion  --   5-/5 no pain   Left Shoulder Extension  5/5    Left Shoulder ABduction  4/5   pain   Left Shoulder Internal Rotation  5/5    Left Shoulder External Rotation  --   5-/5                  [facility] Adult PT Treatment/Exercise - 12/31/19 0001      Shoulder Exercises: Prone   Retraction Limitations  W and L x 10 ea    Flexion  Left;10 reps;Strengthening   feels in first 30 deg up/down   Extension  10 reps;Left    Extension Limitations  painful only at starting position    Horizontal ABduction 1  Strengthening;Left;10  reps   tactlie cues for scapula positioning   Horizontal ABduction 2  Left;10 reps;Strengthening      Shoulder Exercises: Sidelying   Other Sidelying Exercises  empty can x 10      Shoulder Exercises: Standing   Other Standing Exercises  flex/scaption 2# 2x10;    Other Standing Exercises  upright rows bil 5# x 10      Shoulder Exercises: Stretch   Other Shoulder Stretches  sleeper stretch 3x30 sec left       Manual Therapy   Manual Therapy  Passive ROM;Scapular mobilization;Joint mobilization    Joint Mobilization  post G/H jt mobs gd III    Scapular Mobilization  in [facility] all planes    Passive ROM  into IR             PT Education - 12/31/19 1216    Education Details  ionto ed/precautions    Person(s) Educated  Patient    Methods  Explanation;Handout    Comprehension  Verbalized understanding       PT  Short Term Goals - 12/31/19 1021      PT SHORT TERM GOAL #1   Title  Independent with HEP for strengthening and flexibility to increase function.    Status  On-going      PT SHORT TERM GOAL #3   Title  Patient to report no pain with full weightbearing through her LUE    Baseline  mild pain    Status  On-going      PT SHORT TERM GOAL #4   Title  Pt to demo 5/5 left shoulder strength to normalize ADLS.    Status  On-going      PT SHORT TERM GOAL #5   Title  Pt to report decreased pain in the neck by 50% or more with ADLS.    Status  Achieved        PT Long Term Goals - 12/10/19 1404      PT LONG TERM GOAL #1   Title  SEE STGs (LTG=STG)            Plan - 12/31/19 1213    Clinical Impression Statement  Patient continuing to have pain with flex and ABD. We discussed ionto and her sulfa allergy which she reported was a long time ago. After educating pt she agreed to try ionto to ant shoulder. Recert sent to add ionto to POC.    PT Treatment/Interventions  ADLs/Self Care Home Management;Cryotherapy;Electrical Stimulation;Moist Heat;Ultrasound;Therapeutic  exercise;Neuromuscular re-education;Manual techniques;Patient/family education;Dry needling;Passive range of motion;Taping;Joint Manipulations;Spinal Manipulations;Iontophoresis 4mg /ml Dexamethasone    PT Next Visit Plan  assess ionto;  Issue sleeper stretch; Continue with shoulder and scapular strengthening as tolerated, Add prone TE, check shoulder ROM    PT Home Exercise Plan  [email or web address]    Consulted and Agree with Plan of Care  Patient       Patient will benefit from skilled therapeutic intervention in order to improve the following deficits and impairments:  Decreased range of motion, Pain, Decreased strength, Impaired flexibility, Impaired UE functional use  Visit Diagnosis: Acute pain of left shoulder - Plan: PT plan of care cert/re-cert  Cervicalgia - Plan: PT plan of care cert/re-cert  Stiffness of left shoulder, not elsewhere classified - Plan: PT plan of care cert/re-cert  Muscle weakness (generalized) - Plan: PT plan of care cert/re-cert     Problem List Patient Active Problem List   Diagnosis Date Noted  . Impingement syndrome, shoulder, left 11/27/2019  . DDD (degenerative disc disease), cervical 11/27/2019  . Bilateral impacted cerumen 10/31/2018  . Elevated blood pressure reading 10/31/2018  . Serum potassium elevated 07/13/2018  . Leukopenia 07/13/2018  . Muscle tightness 07/13/2018  . Neck pain 07/13/2018  . Primary osteoarthritis of left knee 06/04/2018    Madelyn Flavors PT 12/31/2019, 12:31 PM  [facility] [place], [place], [address] Phone: [phone]   Fax:  [phone]  Name: Casey Castaneda MRN: [id number] Date of Birth: [birth year]

## 2019-12-31 NOTE — Patient Instructions (Signed)

## 2020-01-02 ENCOUNTER — Other Ambulatory Visit: Payer: Self-pay

## 2020-01-02 ENCOUNTER — Ambulatory Visit (INDEPENDENT_AMBULATORY_CARE_PROVIDER_SITE_OTHER): Payer: 59 | Admitting: Physical Therapy

## 2020-01-02 DIAGNOSIS — M6281 Muscle weakness (generalized): Secondary | ICD-10-CM

## 2020-01-02 DIAGNOSIS — M25612 Stiffness of left shoulder, not elsewhere classified: Secondary | ICD-10-CM | POA: Diagnosis not present

## 2020-01-02 DIAGNOSIS — M542 Cervicalgia: Secondary | ICD-10-CM | POA: Diagnosis not present

## 2020-01-02 DIAGNOSIS — M25512 Pain in left shoulder: Secondary | ICD-10-CM

## 2020-01-02 NOTE — Patient Instructions (Signed)
Access Code: [email or web address]  URL: [email or web address]  Date: 01/02/2020  Prepared by: Solon Palm   Exercises Doorway Pec Stretch at 60 Degrees Abduction with Arm Straight - 3 reps - 1 sets - 30-60 sec hold - 2x daily - 7x weekly Seated Levator Scapulae Stretch - 3 reps - 15 hold - 2x daily - 7x weekly Seated Upper Trapezius Stretch - 3 reps - 15 hold - 2x daily - 7x weekly Seated Scapular Retraction - 10 reps - 1-33 sets - 2-3 sec hold - 2x daily - 7x weekly Doorway Pec Stretch at 120 Elevation with Arm Straight - 3 reps - 1 sets - 15-20 hold - 2x daily - 7x weekly Standing Shoulder Row with Anchored Resistance - 10 reps - 1-2 sets - 1x daily - 7x weekly Standing Scapular Retraction - 5 reps - 1 sets - 5 hold - 2-3x daily - 7x weekly Seated Shoulder W - 5 reps - 1 sets - 5-10 hold - 2-3x daily - 7x weekly Seated Thoracic Lumbar Extension with Pectoralis Stretch - 2-3 reps - 1 sets - 5-10 hold - 1x daily - 7x weekly Thoracic Extension Mobilization on Foam Roll - 5 reps - 2 sets - 1x daily - 7x weekly Standing Isometric Shoulder Internal Rotation with Towel Roll at Doorway - 5 reps - 1 sets - 10 sec hold hold - 1x daily - 7x weekly Standing Isometric Shoulder Abduction with Doorway - 5 reps - 1 sets - 10 sec hold - 1x daily - 7x weekly Standing Shoulder External Rotation with Resistance - 10 reps - 2 sets - 1x daily - 7x weekly Supine Serratus Punches Resistance - 10 reps - 3 sets - 1x daily - 7x weekly

## 2020-01-02 NOTE — Therapy (Addendum)
[place], [place], [address] Phone: [phone]   Fax:  [phone]  Physical Therapy Treatment and Discharge Summary  Patient Details  Name: Kasi Lasky MRN: [id number] Date of Birth: 01-21-1956 Referring Provider (PT): Thekkekandam   Encounter Date: 01/02/2020  PT End of Session - 01/02/20 0849    Visit Number  8    Number of Visits  12    Date for PT Re-Evaluation  01/21/20    PT Start Time  0848    PT Stop Time  0937    PT Time Calculation (min)  49 min    Activity Tolerance  Patient tolerated treatment well;No increased pain    Behavior During Therapy  WFL for tasks assessed/performed       Past Medical History:  Diagnosis Date  . Abnormal Pap smear of vagina   . Neck pain   . Shoulder pain     Past Surgical History:  Procedure Laterality Date  . CESAREAN SECTION      There were no vitals filed for this visit.  Subjective Assessment - 01/02/20 0849    Subjective  I think the patch helped with ROM and pain.    Patient Stated Goals  get ROM back without pain and gain strength to lift grandbabies    Currently in Pain?  Yes    Pain Score  1     Pain Location  Shoulder    Pain Orientation  Left;Anterior    Pain Descriptors / Indicators  Sore    Pain Type  Chronic pain                       [facility] Adult PT Treatment/Exercise - 01/02/20 0001      Shoulder Exercises: Supine   Protraction  Both;20 reps;Theraband    Theraband Level (Shoulder Protraction)  Level 2 (Red)    Flexion  Strengthening;Both;10 reps;Theraband   2nd set red   Theraband Level (Shoulder Flexion)  Level 1 (Yellow);Level 2 (Red)    Flexion Limitations  band around back and crisscrossed    Diagonals  Left;20 reps;Weights    Diagonals Limitations  pain with D2 flex      Shoulder Exercises: Sidelying   External Rotation  Strengthening;Left;20 reps;Weights    External Rotation Weight (lbs)  2       Shoulder Exercises: Stretch   Other Shoulder Stretches  sleeper stretch 3x30 sec left       Manual Therapy   Manual Therapy  Soft tissue mobilization;Joint mobilization;Passive ROM    Joint Mobilization  post G/H jt mobs gd III    Soft tissue mobilization  with palpation for assessing muscle tightness    Passive ROM  into IR             PT Education - 01/02/20 1337    Education Details  HEP progressed; discussed POC    Person(s) Educated  Patient    Methods  Explanation;Demonstration;Handout    Comprehension  Verbalized understanding;Returned demonstration       PT Short Term Goals - 12/31/19 1021      PT SHORT TERM GOAL #1   Title  Independent with HEP for strengthening and flexibility to increase function.    Status  On-going      PT SHORT TERM GOAL #3   Title  Patient to report no pain with full weightbearing through her LUE    Baseline  mild pain  Status  On-going      PT SHORT TERM GOAL #4   Title  Pt to demo 5/5 left shoulder strength to normalize ADLS.    Status  On-going      PT SHORT TERM GOAL #5   Title  Pt to report decreased pain in the neck by 50% or more with ADLS.    Status  Achieved        PT Long Term Goals - 12/10/19 1404      PT LONG TERM GOAL #1   Title  SEE STGs (LTG=STG)            Plan - 01/02/20 1344    Clinical Impression Statement  Patient presents today with reports of decreased pain and increased ROM. She tolerates exercises fairly well but pain is still elicited with primarily D2 flex/ext. She also had pain with sleeper stretch for IR today if she didn't roll backwards somewhat.  She did not have any tender areas today other than serratus anterior. We discussed her progress overall and will likely refer back to Dr. Dianah Field after next visit.    PT Frequency  2x / week    PT Duration  6 weeks    PT Treatment/Interventions  ADLs/Self Care Home Management;Cryotherapy;Electrical Stimulation;Moist  Heat;Ultrasound;Therapeutic exercise;Neuromuscular re-education;Manual techniques;Patient/family education;Dry needling;Passive range of motion;Taping;Joint Manipulations;Spinal Manipulations;Iontophoresis 1m/ml Dexamethasone    PT Next Visit Plan  Discuss referring back to MD, do FOTO, check goals.    PT Home Exercise Plan  [email or web address]    Consulted and Agree with Plan of Care  Patient       Patient will benefit from skilled therapeutic intervention in order to improve the following deficits and impairments:  Decreased range of motion, Pain, Decreased strength, Impaired flexibility, Impaired UE functional use  Visit Diagnosis: Cervicalgia  Acute pain of left shoulder  Stiffness of left shoulder, not elsewhere classified  Muscle weakness (generalized)     Problem List Patient Active Problem List   Diagnosis Date Noted  . Impingement syndrome, shoulder, left 11/27/2019  . DDD (degenerative disc disease), cervical 11/27/2019  . Bilateral impacted cerumen 10/31/2018  . Elevated blood pressure reading 10/31/2018  . Serum potassium elevated 07/13/2018  . Leukopenia 07/13/2018  . Muscle tightness 07/13/2018  . Neck pain 07/13/2018  . Primary osteoarthritis of left knee 06/04/2018    JMadelyn FlavorsPT 01/02/2020, 1:51 PM  [facility] [address] [address]Phone: [phone]  Fax:  [phone] Name: DGene ColeeMRN: [id number] of Birth: 910-11-1956 PHYSICAL THERAPY DISCHARGE SUMMARY  Visits from Start of Care: 8  Current functional level related to goals / functional outcomes: See  above   Remaining deficits: See  above   Education / Equipment: HEP Plan: Patient agrees to discharge.  Patient goals were partially met. Patient is being discharged due to lack of progress.  ?????     JMadelyn Flavors PT 01/28/20 7:06 PM;  [facility] [place] [address] [phone](office) [phone](fax)

## 2020-01-07 ENCOUNTER — Encounter: Payer: 59 | Admitting: Physical Therapy

## 2020-01-07 ENCOUNTER — Other Ambulatory Visit: Payer: Self-pay

## 2020-01-07 ENCOUNTER — Ambulatory Visit (INDEPENDENT_AMBULATORY_CARE_PROVIDER_SITE_OTHER): Payer: 59 | Admitting: Sports Medicine

## 2020-01-07 DIAGNOSIS — M5030 Other cervical disc degeneration, unspecified cervical region: Secondary | ICD-10-CM

## 2020-01-07 DIAGNOSIS — M7542 Impingement syndrome of left shoulder: Secondary | ICD-10-CM

## 2020-01-07 MED ORDER — TRIAZOLAM 0.25 MG PO TABS: ORAL_TABLET | ORAL | 0 refills | Status: DC

## 2020-01-07 NOTE — Assessment & Plan Note (Signed)
Columbia continues to have left shoulder pain, worse with overhead activities, she does have degenerative changes on x-rays. Because she has failed greater than 6 weeks of physical therapy, we are going to proceed with MRI with plans to do an injection.

## 2020-01-07 NOTE — Assessment & Plan Note (Signed)
Multilevel DDD on x-rays, significantly better with formal physical therapy.

## 2020-01-07 NOTE — Progress Notes (Signed)
    Procedures performed today:    None.  Independent interpretation of tests performed by another provider:   None.  Impression and Recommendations:    Impingement syndrome, shoulder, left Casey Castaneda continues to have left shoulder pain, worse with overhead activities, she does have degenerative changes on x-rays. Because she has failed greater than 6 weeks of physical therapy, we are going to proceed with MRI with plans to do an injection.   DDD (degenerative disc disease), cervical Multilevel DDD on x-rays, significantly better with formal physical therapy.   ___________________________________________ Ihor Austin. Benjamin Stain, M.D., ABFM., CAQSM. Primary Care and Sports Medicine [facility]  Adjunct Instructor of Family Medicine  [employer]

## 2020-01-09 ENCOUNTER — Encounter: Payer: 59 | Admitting: Physical Therapy

## 2020-01-11 ENCOUNTER — Other Ambulatory Visit: Payer: Self-pay

## 2020-01-11 ENCOUNTER — Ambulatory Visit (INDEPENDENT_AMBULATORY_CARE_PROVIDER_SITE_OTHER): Payer: 59

## 2020-01-11 DIAGNOSIS — M7542 Impingement syndrome of left shoulder: Secondary | ICD-10-CM | POA: Diagnosis not present

## 2020-01-14 ENCOUNTER — Encounter: Payer: 59 | Admitting: Physical Therapy

## 2020-01-16 ENCOUNTER — Encounter: Payer: 59 | Admitting: Physical Therapy

## 2020-01-17 ENCOUNTER — Ambulatory Visit (INDEPENDENT_AMBULATORY_CARE_PROVIDER_SITE_OTHER): Payer: 59 | Admitting: Sports Medicine

## 2020-01-17 ENCOUNTER — Other Ambulatory Visit: Payer: Self-pay

## 2020-01-17 DIAGNOSIS — M7542 Impingement syndrome of left shoulder: Secondary | ICD-10-CM

## 2020-01-17 NOTE — Assessment & Plan Note (Signed)
Casey Castaneda returns, she is a pleasant 64 year old female with impingement related pain in her left shoulder, localized over the deltoid and worse with overhead activities. She has failed conservative measures so today we performed a subacromial injection. Return to see me in 1 month.

## 2020-01-17 NOTE — Progress Notes (Signed)
    Procedures performed today:    Procedure: Real-time Ultrasound Guided injection of the left subacromial bursa Device: Samsung HS60  Verbal informed consent obtained.  Time-out conducted.  Noted no overlying erythema, induration, or other signs of local infection.  Skin prepped in a sterile fashion.  Local anesthesia: Topical Ethyl chloride.  With sterile technique and under real time ultrasound guidance: 1 cc Kenalog 40, 1 cc lidocaine, 1 cc bupivacaine injected easily Completed without difficulty  Pain immediately resolved suggesting accurate placement of the medication.  Advised to call if fevers/chills, erythema, induration, drainage, or persistent bleeding.  Images permanently stored and available for review in the ultrasound unit.  Impression: Technically successful ultrasound guided injection.  Independent interpretation of tests performed by another provider:   None.  Impression and Recommendations:    Impingement syndrome, shoulder, left Casey Castaneda returns, she is a pleasant 64 year old female with impingement related pain in her left shoulder, localized over the deltoid and worse with overhead activities. She has failed conservative measures so today we performed a subacromial injection. Return to see me in 1 month.    ___________________________________________ Ihor Austin. Benjamin Stain, M.D., ABFM., CAQSM. Primary Care and Sports Medicine [facility]  Adjunct Instructor of Family Medicine  [employer]

## 2020-07-20 ENCOUNTER — Encounter: Payer: 59 | Admitting: Physician Assistant

## 2020-07-20 LAB — PATHOLOGIST SMEAR REVIEW

## 2020-07-21 NOTE — Telephone Encounter (Signed)
Ree,  Some reactive(inflammatory) changes but no concerns in smear.

## 2020-08-05 ENCOUNTER — Telehealth (INDEPENDENT_AMBULATORY_CARE_PROVIDER_SITE_OTHER): Payer: 59 | Admitting: Physician Assistant

## 2020-08-05 DIAGNOSIS — Z13820 Encounter for screening for osteoporosis: Secondary | ICD-10-CM

## 2020-08-05 DIAGNOSIS — Z13220 Encounter for screening for lipoid disorders: Secondary | ICD-10-CM

## 2020-08-05 DIAGNOSIS — Z131 Encounter for screening for diabetes mellitus: Secondary | ICD-10-CM

## 2020-08-05 DIAGNOSIS — M810 Age-related osteoporosis without current pathological fracture: Secondary | ICD-10-CM

## 2020-08-05 DIAGNOSIS — Z0000 Encounter for general adult medical examination without abnormal findings: Secondary | ICD-10-CM

## 2020-08-05 DIAGNOSIS — D72819 Decreased white blood cell count, unspecified: Secondary | ICD-10-CM

## 2020-08-05 DIAGNOSIS — Z780 Asymptomatic menopausal state: Secondary | ICD-10-CM

## 2020-08-05 DIAGNOSIS — Z1159 Encounter for screening for other viral diseases: Secondary | ICD-10-CM

## 2020-08-05 DIAGNOSIS — J302 Other seasonal allergic rhinitis: Secondary | ICD-10-CM

## 2020-08-05 NOTE — Progress Notes (Signed)
Patient ID: Casey Castaneda, female   DOB: [birth year], 64 y.o.   MRN: [id number] .Marland KitchenVirtual Visit via Video Note  I connected with Deriyah Kunath on 08/05/2020 at  8:50 AM EDT by a video enabled telemedicine application and verified that I am speaking with the correct person using two identifiers.  Location: Patient: home Provider: clinic   I discussed the limitations of evaluation and management by telemedicine and the availability of in person appointments. The patient expressed understanding and agreed to proceed.  History of Present Illness: Pt is a 64 yo female seasonal allergies and leukopenia who presents to the clinic for routine physical.   She is doing well. She is taking vitamin D and calcium. She is up to date on screening. She is sleeping well. She has the occasional allergies. She denies any mood changes. She has pretty good energy. She does have some hot flashes but she has had for a while and states "she is used to them". No fevers, chills, body aches, weight loss. No recent illness. She is pretty active.   .. Family History  Problem Relation Age of Onset  . Leukemia Mother   . Hypertension Father    .[name] Social History   Socioeconomic History  . Marital status: Married    Spouse name: Not on file  . Number of children: Not on file  . Years of education: Not on file  . Highest education level: Not on file  Occupational History  . Not on file  Tobacco Use  . Smoking status: Former Games developer  . Smokeless tobacco: Never Used  Vaping Use  . Vaping Use: Never used  Substance and Sexual Activity  . Alcohol use: Yes    Comment: red wine 4 q wk  . Drug use: No  . Sexual activity: Not on file  Other Topics Concern  . Not on file  Social History Narrative  . Not on file   Social Determinants of Health   Financial Resource Strain:   . Difficulty of Paying Living Expenses: Not on file  Food Insecurity:   . Worried About Programme researcher, broadcasting/film/video in the Last Year: Not on file  .  Ran Out of Food in the Last Year: Not on file  Transportation Needs:   . Lack of Transportation (Medical): Not on file  . Lack of Transportation (Non-Medical): Not on file  Physical Activity:   . Days of Exercise per Week: Not on file  . Minutes of Exercise per Session: Not on file  Stress:   . Feeling of Stress : Not on file  Social Connections:   . Frequency of Communication with Friends and Family: Not on file  . Frequency of Social Gatherings with Friends and Family: Not on file  . Attends Religious Services: Not on file  . Active Member of Clubs or Organizations: Not on file  . Attends Banker Meetings: Not on file  . Marital Status: Not on file  Intimate Partner Violence:   . Fear of Current or Ex-Partner: Not on file  . Emotionally Abused: Not on file  . Physically Abused: Not on file  . Sexually Abused: Not on file   Reviewed med, allergies, problem list.   Observations/Objective: NO acute distress Normal mood.  No labored breathing.   [name].There were no vitals filed for this visit. There is no height or weight on file to calculate BMI.    .. Depression screen [facility] 2/9 08/05/2020 07/19/2019 10/31/2018 07/13/2018  Decreased Interest 0 0  0 0  Down, Depressed, Hopeless 1 0 0 0  PHQ - 2 Score 1 0 0 0  Altered sleeping 1 0 0 -  Tired, decreased energy 0 0 - -  Change in appetite 0 0 0 -  Feeling bad or failure about yourself  0 0 0 -  Trouble concentrating 0 0 0 -  Moving slowly or fidgety/restless 0 0 0 -  Suicidal thoughts 0 0 0 -  PHQ-9 Score 2 0 0 -  Difficult doing work/chores Not difficult at all Not difficult at all Not difficult at all -    Assessment and Plan: Marland KitchenMarland KitchenKoula was seen today for annual exam.  Diagnoses and all orders for this visit:  Routine physical examination -     Hepatitis C Antibody -     Lipid Panel w/reflex Direct LDL -     COMPLETE METABOLIC PANEL WITH GFR -     CBC w/Diff/Platelet  Encounter for hepatitis C screening test  for low risk patient -     Hepatitis C Antibody  Leukopenia, unspecified type -     CBC w/Diff/Platelet  Screening for diabetes mellitus -     COMPLETE METABOLIC PANEL WITH GFR  Screening for lipid disorders  Seasonal allergies  Post-menopausal -     DG Bone Density  Osteoporosis screening -     DG Bone Density   .[name] Discussed 150 minutes of exercise a week.  Encouraged vitamin D 1000 units and Calcium 1300mg  or 4 servings of dairy a day.  Mammogram UTD.  Bone density ordered.  Colonoscopy UTD.  Pap UTD.  covid vaccine completed.  Encouraged flu shot.  Fasting labs ordered.   WBC continues to be low. Will check CBC again. Path smear showed reactive mature WBC. RBC and platelets looked good. Will consult hematology. Pt is asymptomatic. Her mother did have leukemia.    Follow Up Instructions:    I discussed the assessment and treatment plan with the patient. The patient was provided an opportunity to ask questions and all were answered. The patient agreed with the plan and demonstrated an understanding of the instructions.   The patient was advised to call back or seek an in-person evaluation if the symptoms worsen or if the condition fails to improve as anticipated.  I provided 18 minutes of non-face-to-face time during this encounter.   , PA-C

## 2020-08-05 NOTE — Progress Notes (Signed)
Wants to discuss recent blood work [employer] completed.

## 2020-08-06 LAB — COMPLETE METABOLIC PANEL WITH GFR
AG Ratio: 1.7 (calc) (ref 1.0–2.5)
ALT: 14 U/L (ref 6–29)
AST: 23 U/L (ref 10–35)
Albumin: 4.7 g/dL (ref 3.6–5.1)
Alkaline phosphatase (APISO): 53 U/L (ref 37–153)
BUN: 11 mg/dL (ref 7–25)
CO2: 31 mmol/L (ref 20–32)
Calcium: 9.8 mg/dL (ref 8.6–10.4)
Chloride: 103 mmol/L (ref 98–110)
Creat: 0.92 mg/dL (ref 0.50–0.99)
GFR, Est African American: 76 mL/min/{1.73_m2} (ref >=60)
GFR, Est Non African American: 66 mL/min/{1.73_m2} (ref >=60)
Globulin: 2.8 g/dL (calc) (ref 1.9–3.7)
Glucose, Bld: 90 mg/dL (ref 65–99)
Potassium: 5.3 mmol/L (ref 3.5–5.3)
Sodium: 140 mmol/L (ref 135–146)
Total Bilirubin: 0.6 mg/dL (ref 0.2–1.2)
Total Protein: 7.5 g/dL (ref 6.1–8.1)

## 2020-08-06 LAB — CBC WITH DIFFERENTIAL/PLATELET
Absolute Monocytes: 237 cells/uL (ref 200–950)
Basophils Absolute: 30 cells/uL (ref 0–200)
Basophils Relative: 1.3 %
Eosinophils Absolute: 30 cells/uL (ref 15–500)
Eosinophils Relative: 1.3 %
HCT: 42.4 % (ref 35.0–45.0)
Hemoglobin: 13.6 g/dL (ref 11.7–15.5)
Lymphs Abs: 796 cells/uL — ABNORMAL LOW (ref 850–3900)
MCH: 28.9 pg (ref 27.0–33.0)
MCHC: 32.1 g/dL (ref 32.0–36.0)
MCV: 90 fL (ref 80.0–100.0)
MPV: 11.5 fL (ref 7.5–12.5)
Monocytes Relative: 10.3 %
Neutro Abs: 1208 cells/uL — ABNORMAL LOW (ref 1500–7800)
Neutrophils Relative %: 52.5 %
Platelets: 235 10*3/uL (ref 140–400)
RBC: 4.71 10*6/uL (ref 3.80–5.10)
RDW: 12 % (ref 11.0–15.0)
Total Lymphocyte: 34.6 %
WBC: 2.3 10*3/uL — ABNORMAL LOW (ref 3.8–10.8)

## 2020-08-06 LAB — LIPID PANEL W/REFLEX DIRECT LDL
Cholesterol: 225 mg/dL — ABNORMAL HIGH (ref <200)
HDL: 130 mg/dL (ref >=50)
LDL Cholesterol (Calc): 81 mg/dL (calc)
Non-HDL Cholesterol (Calc): 95 mg/dL (calc) (ref <130)
Total CHOL/HDL Ratio: 1.7 (calc) (ref <5.0)
Triglycerides: 48 mg/dL (ref <150)

## 2020-08-06 LAB — HEPATITIS C ANTIBODY
Hepatitis C Ab: NONREACTIVE
SIGNAL TO CUT-OFF: 0.01 (ref <1.00)

## 2020-08-07 ENCOUNTER — Encounter: Payer: Self-pay | Admitting: Physician Assistant

## 2020-08-07 DIAGNOSIS — J302 Other seasonal allergic rhinitis: Secondary | ICD-10-CM | POA: Insufficient documentation

## 2020-08-07 NOTE — Progress Notes (Signed)
Hey,   Take a look at this patient. She is very healthy. On no medications. She is asymptomatic of any B symptoms. She does have family history with her mother of some form of leukemia. Her WBC is low and stays that way. Should I send her to you guys for work up?

## 2020-08-07 NOTE — Progress Notes (Signed)
Hey Mahathi,   YOUR cholesterol is fantastic.   Labs look great except for low WBC. I sent a message to hematology since you feel great, on no medications and no other lab findings. I will let you know what they say.

## 2020-08-10 NOTE — Progress Notes (Signed)
Patient reviewed results and recommendations on mychart.   

## 2020-08-10 NOTE — Progress Notes (Signed)
Casey Castaneda,   This was reply from hematologist.   [name], Brand Males, NP  Dr. 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 No referral needed at this time. If her WBC count drops below 2 or she becomes symptomatic (frequent infections, extreme fatigue, etc...) let us know and we will get her in!  Will can continue to watch for symptoms or drop below 2!   Recheck every 6 months CBC.   Let me know if you have any questions.  -Lesly Rubenstein

## 2020-10-19 ENCOUNTER — Other Ambulatory Visit: Payer: Self-pay | Admitting: Physician Assistant

## 2020-10-19 DIAGNOSIS — Z1231 Encounter for screening mammogram for malignant neoplasm of breast: Secondary | ICD-10-CM

## 2020-12-23 ENCOUNTER — Ambulatory Visit (INDEPENDENT_AMBULATORY_CARE_PROVIDER_SITE_OTHER): Payer: 59

## 2020-12-23 ENCOUNTER — Other Ambulatory Visit: Payer: Self-pay

## 2020-12-23 DIAGNOSIS — Z1231 Encounter for screening mammogram for malignant neoplasm of breast: Secondary | ICD-10-CM

## 2020-12-23 DIAGNOSIS — Z13820 Encounter for screening for osteoporosis: Secondary | ICD-10-CM | POA: Diagnosis not present

## 2020-12-23 DIAGNOSIS — Z780 Asymptomatic menopausal state: Secondary | ICD-10-CM

## 2020-12-23 DIAGNOSIS — M810 Age-related osteoporosis without current pathological fracture: Secondary | ICD-10-CM | POA: Insufficient documentation

## 2020-12-23 NOTE — Progress Notes (Signed)
Normal mammogram. Follow up in 1 year.

## 2020-12-23 NOTE — Progress Notes (Signed)
Dexa scan shows osteoporosis, weaken bones.You are already on vitamin D and calcium which is great! We do need to talk about other options. We can do this by phone or in person.

## 2021-07-19 IMAGING — DX DG CERVICAL SPINE COMPLETE 4+V
6 series · 6 of 6 positions shown · non-contrast
Comparison: No prior.

CLINICAL DATA: Left-sided neck pain and stiffness. No known injury.

EXAM:
CERVICAL SPINE - COMPLETE 4+ VIEW

[c-spine lat]
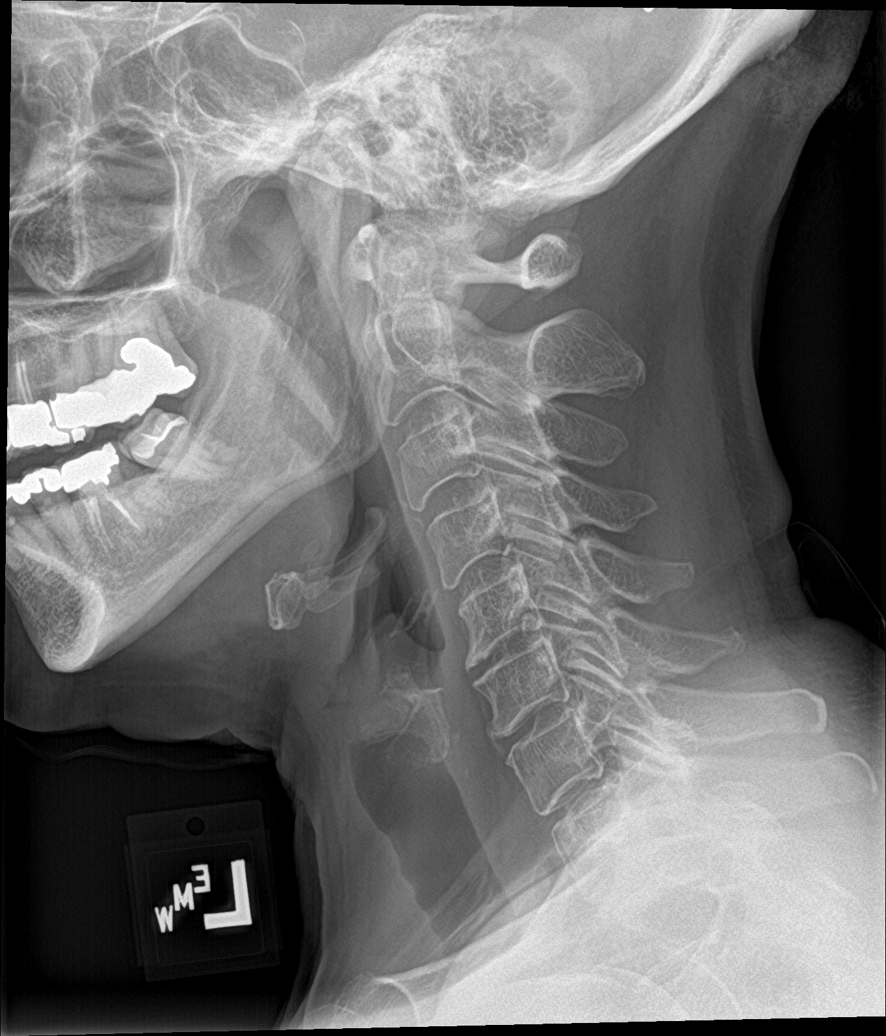

[c-spine obl (1 of 2)]
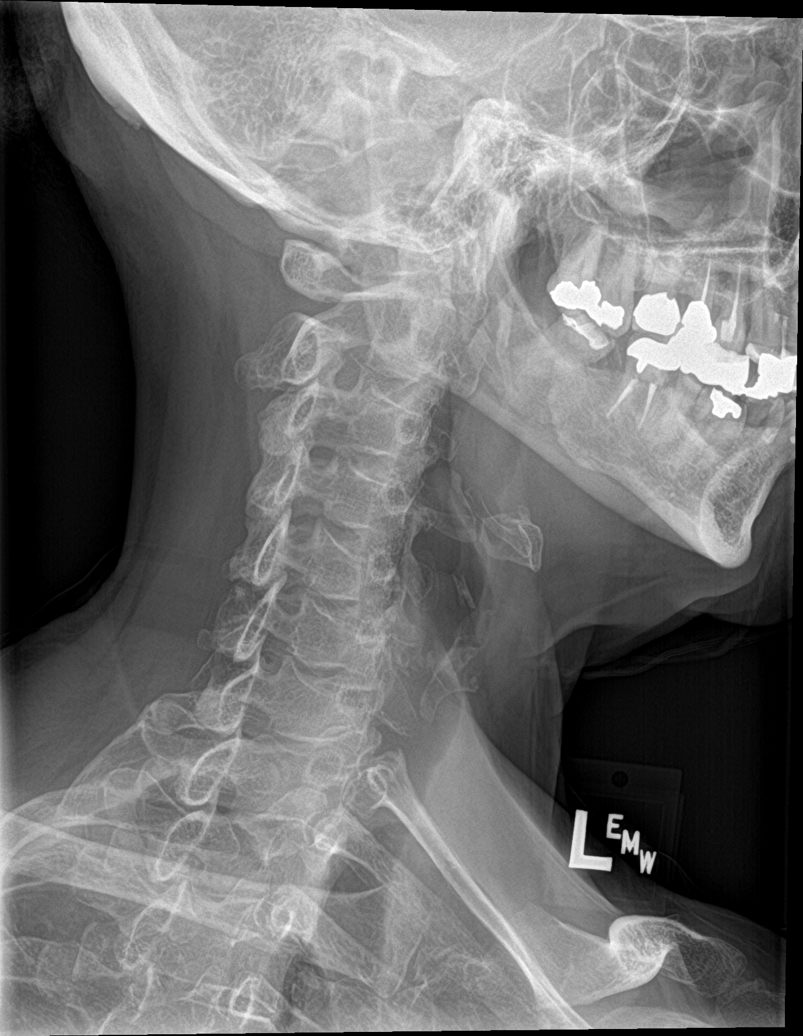

[c-spine obl (2 of 2)]
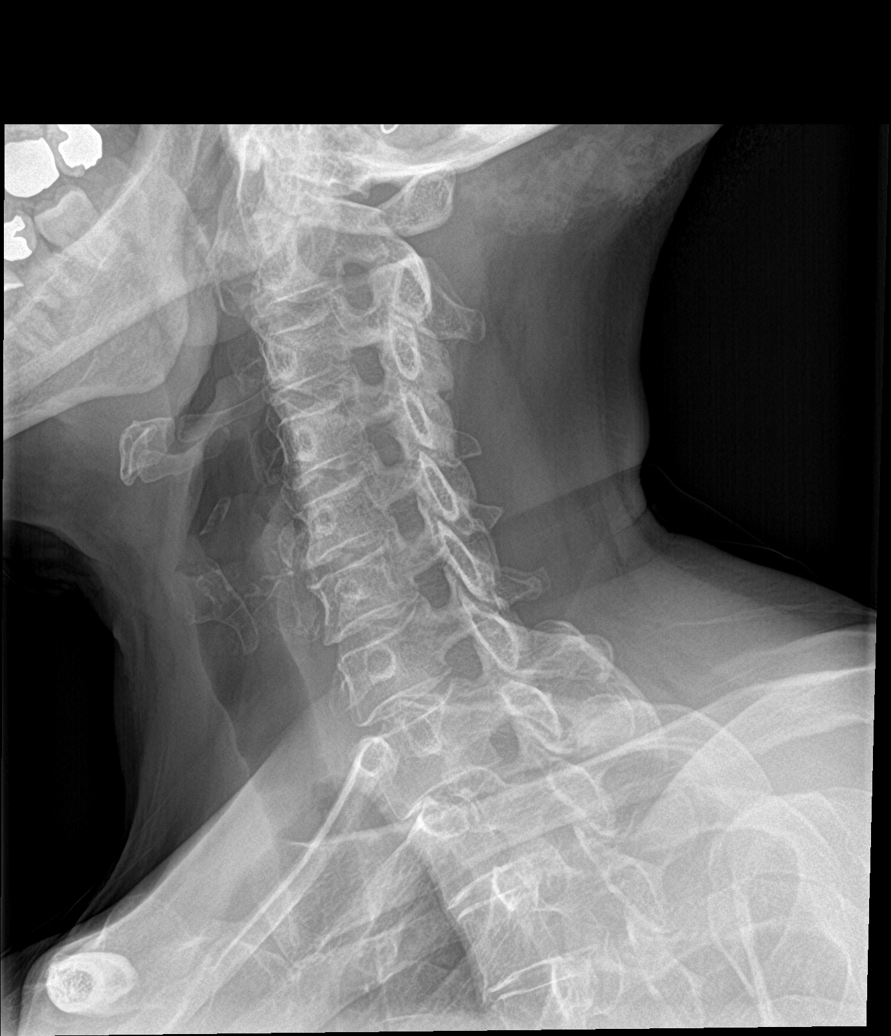

[c-spine ap]
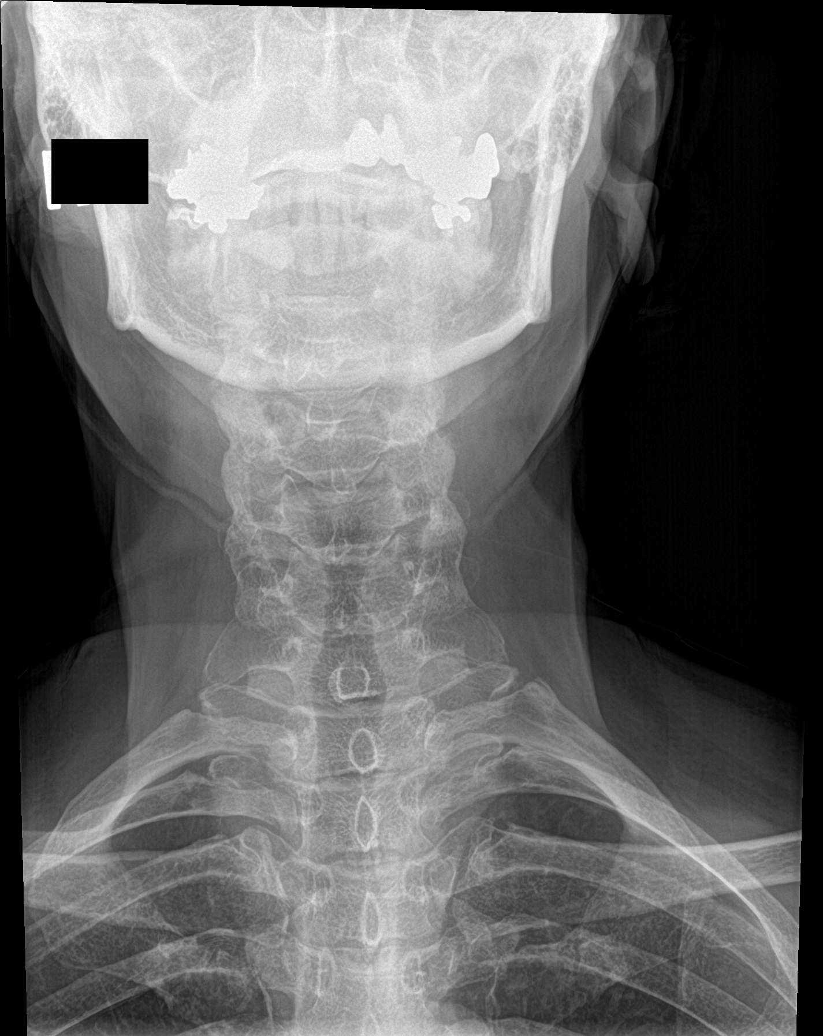

[c-spine open mouth]
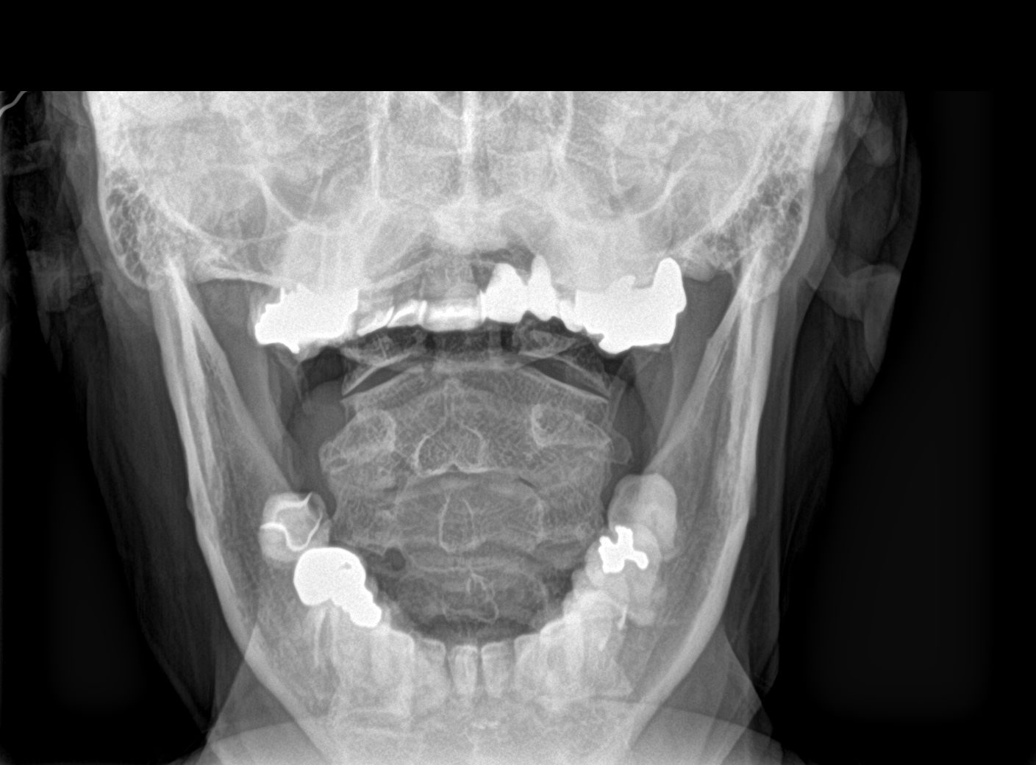

[[person_name]]
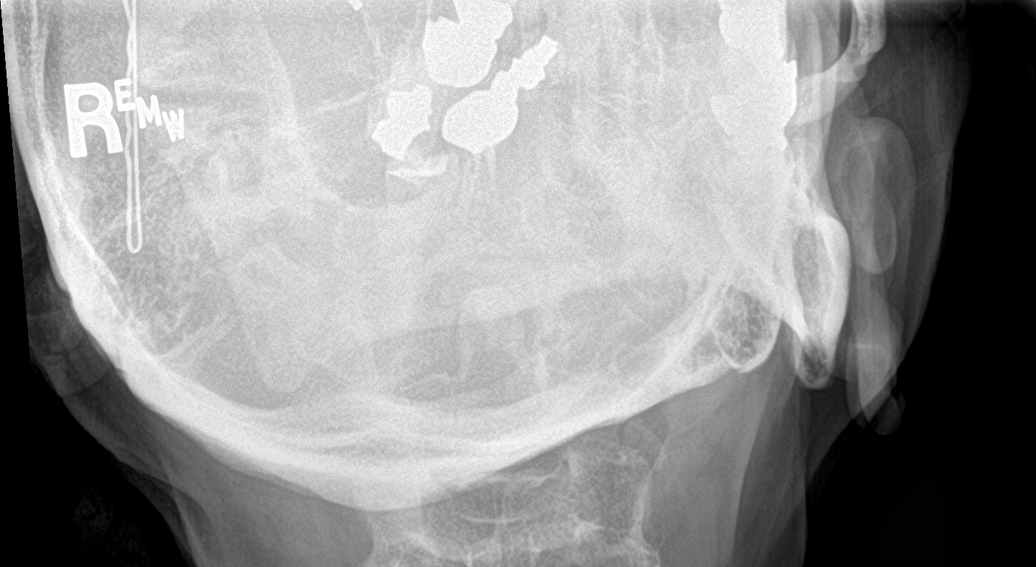

[6 of 6 positions shown; findings below may reference images not displayed]

FINDINGS: C5-C6, C6-C7, C7-T1 disc degeneration and endplate osteophyte
formation. No acute bony abnormality identified. Neuroforamen are
patent. Pulmonary apices are clear.
IMPRESSION: C5-C6, C6-C7, C7-T1 disc degeneration endplate osteophyte formation.
No acute abnormality.

## 2021-07-19 IMAGING — DX DG SHOULDER 2+V*L*
3 series · 3 of 3 positions shown · non-contrast
Comparison: No prior.

CLINICAL DATA: Impingement syndrome.

EXAM:
LEFT SHOULDER - 2+ VIEW

[shoulder grashey]
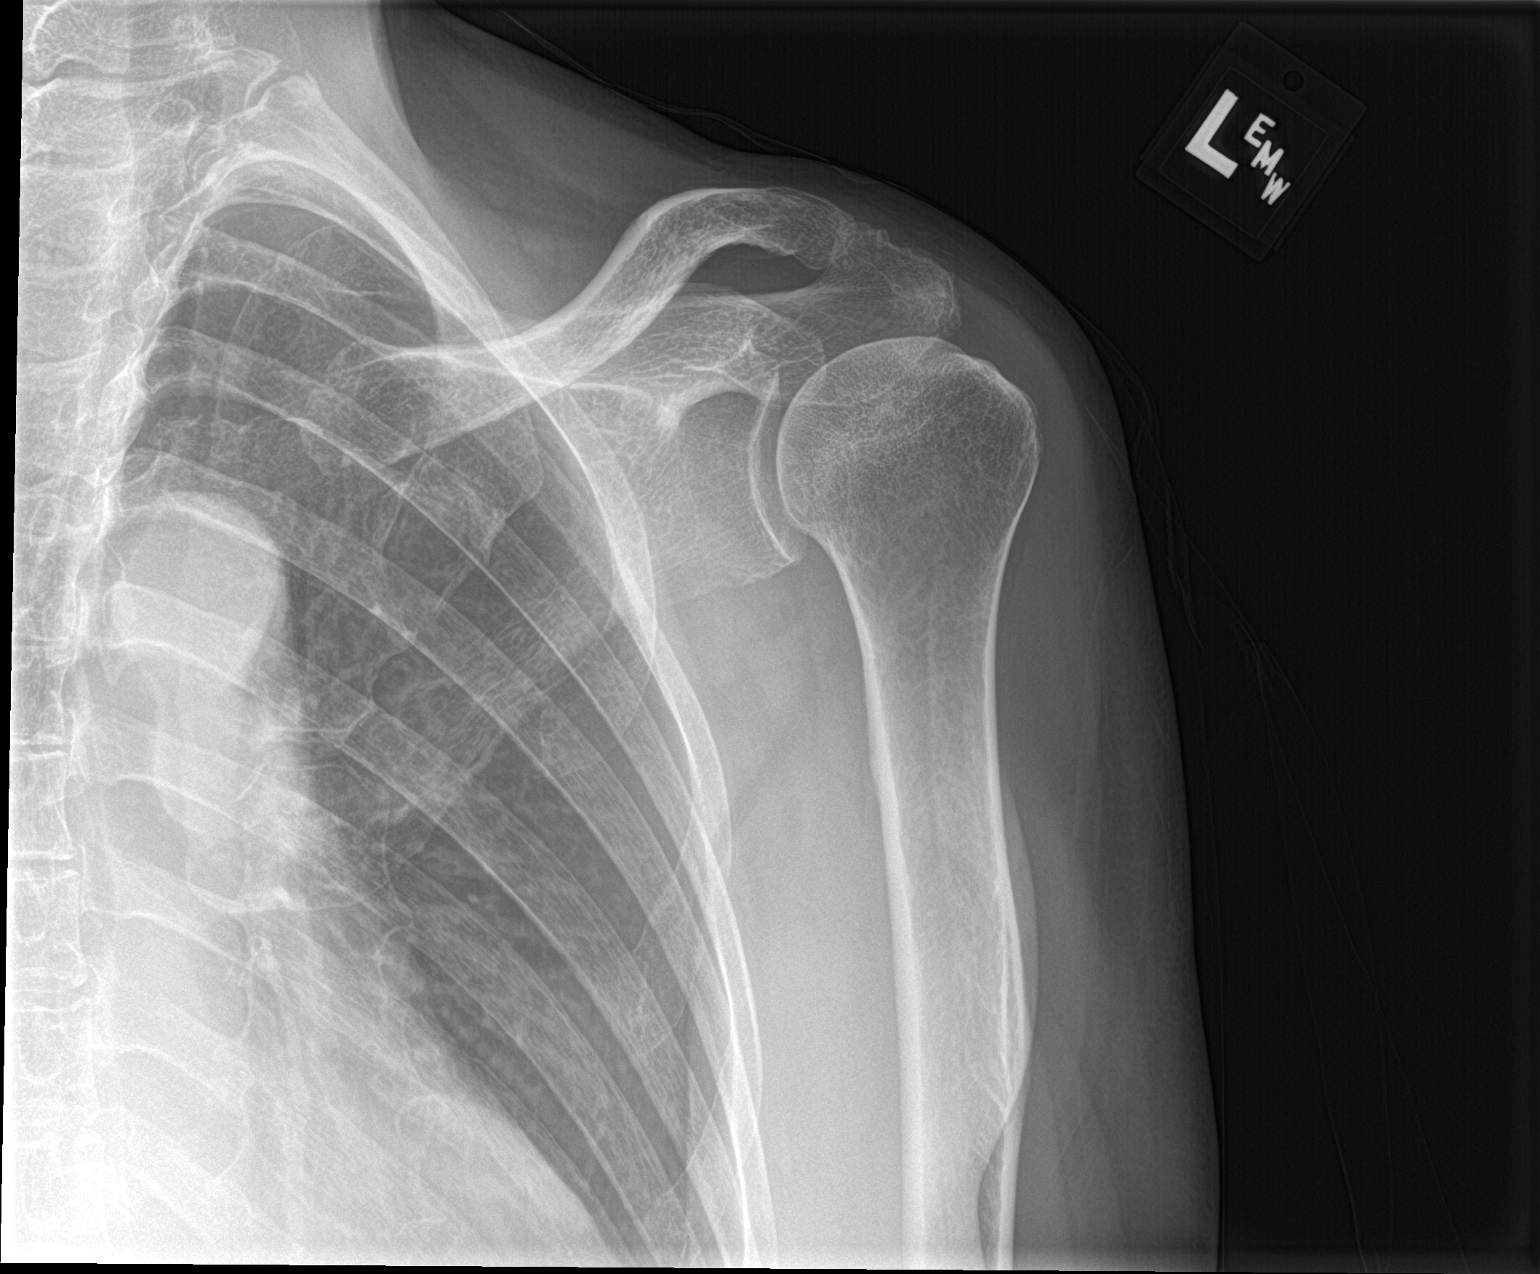

[shoulder y view]
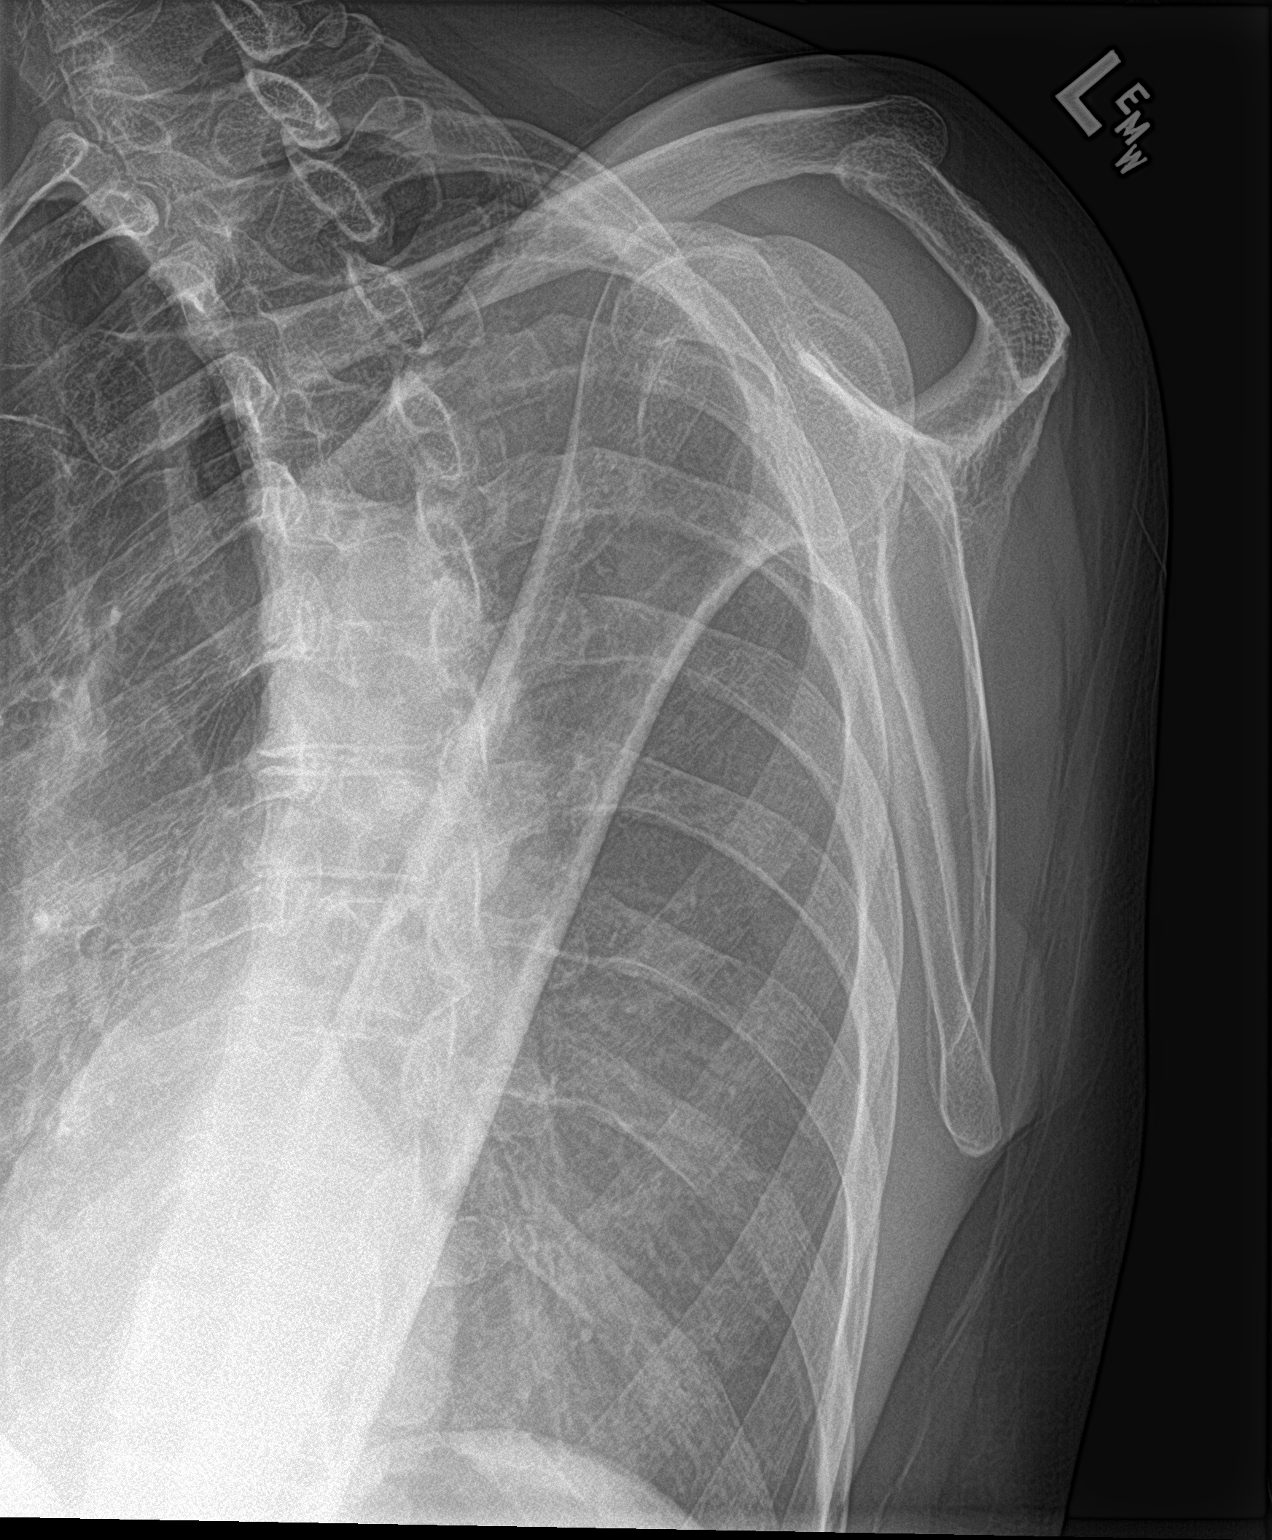

[shoulder axillary]
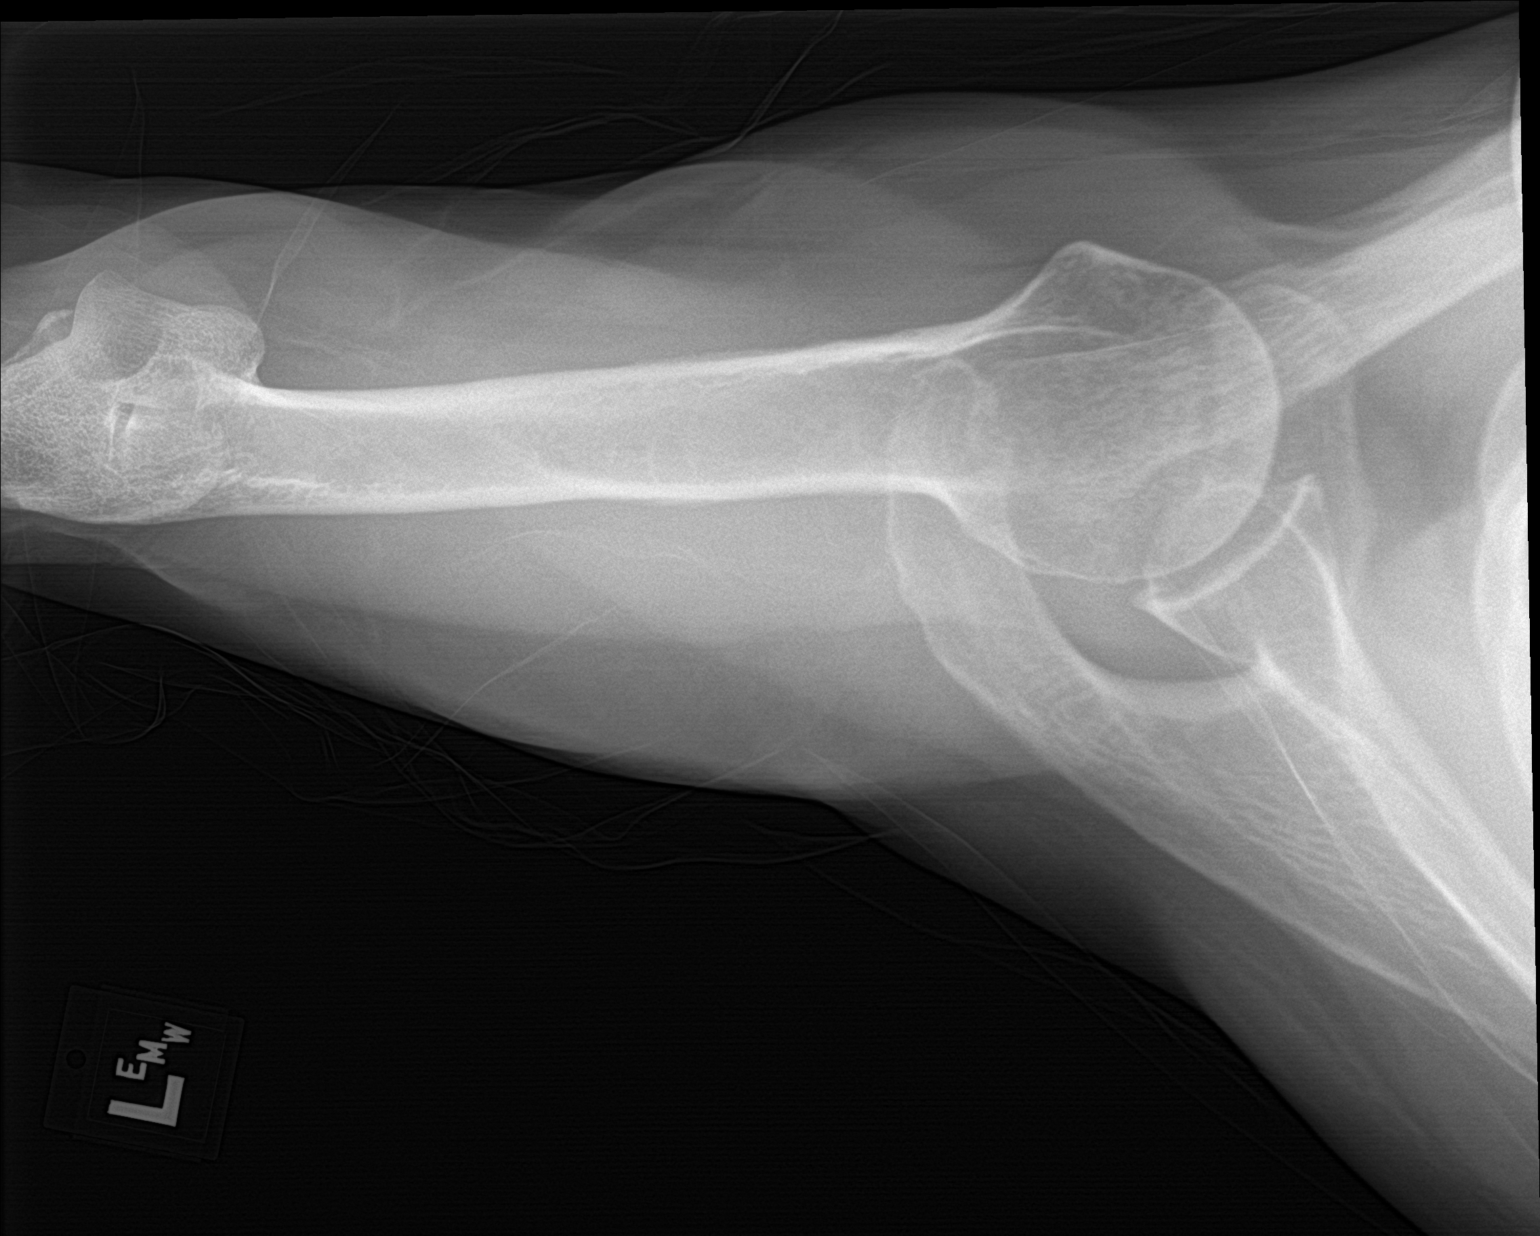

[3 of 3 positions shown; findings below may reference images not displayed]

FINDINGS: Mild acromioclavicular and glenohumeral degenerative change.
Subacromial spurring. No acute bony abnormality. No evidence of
fracture dislocation. No evidence of separation.
IMPRESSION: Mild acromioclavicular glenohumeral degenerative change. Subacromial
spurring. No acute abnormality.

## 2021-09-02 ENCOUNTER — Telehealth: Payer: Self-pay | Admitting: Physician Assistant

## 2021-09-02 DIAGNOSIS — Z0000 Encounter for general adult medical examination without abnormal findings: Secondary | ICD-10-CM

## 2021-09-02 DIAGNOSIS — Z1329 Encounter for screening for other suspected endocrine disorder: Secondary | ICD-10-CM

## 2021-09-02 DIAGNOSIS — Z13220 Encounter for screening for lipoid disorders: Secondary | ICD-10-CM

## 2021-09-02 DIAGNOSIS — Z131 Encounter for screening for diabetes mellitus: Secondary | ICD-10-CM

## 2021-09-02 NOTE — Telephone Encounter (Signed)
Lab order placed.

## 2021-09-02 NOTE — Telephone Encounter (Signed)
Mr. Pupo called. His wife has a Physical on 9/26.  She wants her lab Order put in for tomorrow 9/23. Thank you.

## 2021-09-06 ENCOUNTER — Ambulatory Visit: Payer: 59 | Admitting: Physician Assistant

## 2021-09-09 ENCOUNTER — Other Ambulatory Visit: Payer: Self-pay

## 2021-09-09 ENCOUNTER — Ambulatory Visit (INDEPENDENT_AMBULATORY_CARE_PROVIDER_SITE_OTHER): Payer: 59 | Admitting: Physician Assistant

## 2021-09-09 VITALS — BP 133/68 | HR 60 | Ht 64.0 in | Wt 147.0 lb

## 2021-09-09 DIAGNOSIS — M810 Age-related osteoporosis without current pathological fracture: Secondary | ICD-10-CM | POA: Diagnosis not present

## 2021-09-09 DIAGNOSIS — Z0000 Encounter for general adult medical examination without abnormal findings: Secondary | ICD-10-CM | POA: Diagnosis not present

## 2021-09-09 NOTE — Patient Instructions (Signed)
Health Maintenance After Age 66 After age 69, you are at a higher risk for certain long-term diseases and infections as well as injuries from falls. Falls are a major cause of broken bones and head injuries in people who are older than age 82. Getting regular preventive care can help to keep you healthy and well. Preventive care includes getting regular testing and making lifestyle changes as recommended by your health care provider. Talk with your health care provider about: Which screenings and tests you should have. A screening is a test that checks for a disease when you have no symptoms. A diet and exercise plan that is right for you. What should I know about screenings and tests to prevent falls? Screening and testing are the best ways to find a health problem early. Early diagnosis and treatment give you the best chance of managing medical conditions that are common after age 41. Certain conditions and lifestyle choices may make you more likely to have a fall. Your health care provider may recommend: Regular vision checks. Poor vision and conditions such as cataracts can make you more likely to have a fall. If you wear glasses, make sure to get your prescription updated if your vision changes. Medicine review. Work with your health care provider to regularly review all of the medicines you are taking, including over-the-counter medicines. Ask your health care provider about any side effects that may make you more likely to have a fall. Tell your health care provider if any medicines that you take make you feel dizzy or sleepy. Osteoporosis screening. Osteoporosis is a condition that causes the bones to get weaker. This can make the bones weak and cause them to break more easily. Blood pressure screening. Blood pressure changes and medicines to control blood pressure can make you feel dizzy. Strength and balance checks. Your health care provider may recommend certain tests to check your strength and  balance while standing, walking, or changing positions. Foot health exam. Foot pain and numbness, as well as not wearing proper footwear, can make you more likely to have a fall. Depression screening. You may be more likely to have a fall if you have a fear of falling, feel emotionally low, or feel unable to do activities that you used to do. Alcohol use screening. Using too much alcohol can affect your balance and may make you more likely to have a fall. What actions can I take to lower my risk of falls? General instructions Talk with your health care provider about your risks for falling. Tell your health care provider if: You fall. Be sure to tell your health care provider about all falls, even ones that seem minor. You feel dizzy, sleepy, or off-balance. Take over-the-counter and prescription medicines only as told by your health care provider. These include any supplements. Eat a healthy diet and maintain a healthy weight. A healthy diet includes low-fat dairy products, low-fat (lean) meats, and fiber from whole grains, beans, and lots of fruits and vegetables. Home safety Remove any tripping hazards, such as rugs, cords, and clutter. Install safety equipment such as grab bars in bathrooms and safety rails on stairs. Keep rooms and walkways well-lit. Activity  Follow a regular exercise program to stay fit. This will help you maintain your balance. Ask your health care provider what types of exercise are appropriate for you. If you need a cane or walker, use it as recommended by your health care provider. Wear supportive shoes that have nonskid soles. Lifestyle Do not  drink alcohol if your health care provider tells you not to drink. If you drink alcohol, limit how much you have: 0-1 drink a day for women. 0-2 drinks a day for men. Be aware of how much alcohol is in your drink. In the [place] one drink equals one typical bottle of beer (12 oz), one-half glass of wine (5 oz), or one shot of  hard liquor (1 oz). Do not use any products that contain nicotine or tobacco, such as cigarettes and e-cigarettes. If you need help quitting, ask your health care provider. Summary Having a healthy lifestyle and getting preventive care can help to protect your health and wellness after age 79. Screening and testing are the best way to find a health problem early and help you avoid having a fall. Early diagnosis and treatment give you the best chance for managing medical conditions that are more common for people who are older than age 18. Falls are a major cause of broken bones and head injuries in people who are older than age 68. Take precautions to prevent a fall at home. Work with your health care provider to learn what changes you can make to improve your health and wellness and to prevent falls. This information is not intended to replace advice given to you by your health care provider. Make sure you discuss any questions you have with your health care provider. Document Revised: 02/05/2021 Document Reviewed: 11/13/2020 Elsevier Patient Education  2022 [place]. Osteoporosis Osteoporosis happens when the bones become thin and less dense than normal. Osteoporosis makes bones more brittle and fragile and more likely to break (fracture). Over time, osteoporosis can cause your bones to become so weak that they fracture after a minor fall. Bones in the hip, wrist, and spine are most likely to fracture due to osteoporosis. What are the causes? The exact cause of this condition is not known. What increases the risk? You are more likely to develop this condition if you: Have family members with this condition. Have poor nutrition. Use the following: Steroid medicines, such as prednisone. Anti-seizure medicines. Nicotine or tobacco, such as cigarettes, e-cigarettes, and chewing tobacco. Are female. Are age 29 or older. Are not physically active (are sedentary). Are of European or Asian  descent. Have a small body frame. What are the signs or symptoms? A fracture might be the first sign of osteoporosis, especially if the fracture results from a fall or injury that usually would not cause a bone to break. Other signs and symptoms include: Pain in the neck or low back. Stooped posture. Loss of height. How is this diagnosed? This condition may be diagnosed based on: Your medical history. A physical exam. A bone mineral density test, also called a DXA or DEXA test (dual-energy X-ray absorptiometry test). This test uses X-rays to measure the amount of minerals in your bones. How is this treated? This condition may be treated by: Making lifestyle changes, such as: Including foods with more calcium and vitamin D in your diet. Doing weight-bearing and muscle-strengthening exercises. Stopping tobacco use. Limiting alcohol intake. Taking medicine to slow the process of bone loss or to increase bone density. Taking daily supplements of calcium and vitamin D. Taking hormone replacement medicines, such as estrogen for women and testosterone for men. Monitoring your levels of calcium and vitamin D. The goal of treatment is to strengthen your bones and lower your risk for a fracture. Follow these instructions at home: Eating and drinking Include calcium and vitamin D in  your diet. Calcium is important for bone health, and vitamin D helps your body absorb calcium. Good sources of calcium and vitamin D include: Certain fatty fish, such as salmon and tuna. Products that have calcium and vitamin D added to them (are fortified), such as fortified cereals. Egg yolks. Cheese. Liver.  Activity Do exercises as told by your health care provider. Ask your health care provider what exercises and activities are safe for you. You should do: Exercises that make you work against gravity (weight-bearing exercises), such as tai chi, yoga, or walking. Exercises to strengthen muscles, such as  lifting weights. Lifestyle Do not drink alcohol if: Your health care provider tells you not to drink. You are pregnant, may be pregnant, or are planning to become pregnant. If you drink alcohol: Limit how much you use to: 0-1 drink a day for women. 0-2 drinks a day for men. Know how much alcohol is in your drink. In the [place] one drink equals one 12 oz bottle of beer (355 mL), one 5 oz glass of wine (148 mL), or one 1 oz glass of hard liquor (44 mL). Do not use any products that contain nicotine or tobacco, such as cigarettes, e-cigarettes, and chewing tobacco. If you need help quitting, ask your health care provider. Preventing falls Use devices to help you move around (mobility aids) as needed, such as canes, walkers, scooters, or crutches. Keep rooms well-lit and clutter-free. Remove tripping hazards from walkways, including cords and throw rugs. Install grab bars in bathrooms and safety rails on stairs. Use rubber mats in the bathroom and other areas that are often wet or slippery. Wear closed-toe shoes that fit well and support your feet. Wear shoes that have rubber soles or low heels. Review your medicines with your health care provider. Some medicines can cause dizziness or changes in blood pressure, which can increase your risk of falling. General instructions Take over-the-counter and prescription medicines only as told by your health care provider. Keep all follow-up visits. This is important. Contact a health care provider if: You have never been screened for osteoporosis and you are: A woman who is age 25 or older. A man who is age 19 or older. Get help right away if: You fall or injure yourself. Summary Osteoporosis is thinning and loss of density in your bones. This makes bones more brittle and fragile and more likely to break (fracture),even with minor falls. The goal of treatment is to strengthen your bones and lower your risk for a fracture. Include calcium and  vitamin D in your diet. Calcium is important for bone health, and vitamin D helps your body absorb calcium. Talk with your health care provider about screening for osteoporosis if you are a woman who is age 25 or older, or a man who is age 6 or older. This information is not intended to replace advice given to you by your health care provider. Make sure you discuss any questions you have with your health care provider. Document Revised: 05/14/2020 Document Reviewed: 05/14/2020 Elsevier Patient Education  [place].

## 2021-09-09 NOTE — Progress Notes (Signed)
Subjective:     Suriyah Vergara is a 65 y.o. female and is here for a comprehensive physical exam. The patient reports no problems.  Social History   Socioeconomic History   Marital status: Married    Spouse name: Not on file   Number of children: Not on file   Years of education: Not on file   Highest education level: Not on file  Occupational History   Not on file  Tobacco Use   Smoking status: Former   Smokeless tobacco: Never  Vaping Use   Vaping Use: Never used  Substance and Sexual Activity   Alcohol use: Yes    Comment: red wine 4 q wk   Drug use: No   Sexual activity: Not on file  Other Topics Concern   Not on file  Social History Narrative   Not on file   Social Determinants of Health   Financial Resource Strain: Not on file  Food Insecurity: Not on file  Transportation Needs: Not on file  Physical Activity: Not on file  Stress: Not on file  Social Connections: Not on file  Intimate Partner Violence: Not on file   Health Maintenance  Topic Date Due   COVID-19 Vaccine (4 - Booster for Pfizer series) 09/25/2021 (Originally 02/09/2021)   INFLUENZA VACCINE  03/11/2022 (Originally 07/12/2021)   MAMMOGRAM  12/23/2022   COLONOSCOPY (Pts 45-68yrs Insurance coverage will need to be confirmed)  12/13/2023   TETANUS/TDAP  12/12/2024   DEXA SCAN  Completed   Hepatitis C Screening  Completed   Zoster Vaccines- Shingrix  Completed   HPV VACCINES  Aged Out    The following portions of the patient's history were reviewed and updated as appropriate: allergies, current medications, past family history, past medical history, past social history, past surgical history, and problem list.  Review of Systems A comprehensive review of systems was negative.   Objective:    BP 133/68   Pulse 60   Ht 5\' 4"  (1.626 m)   Wt 147 lb (66.7 kg)   SpO2 100%   BMI 25.23 kg/m  General appearance: alert, cooperative, and appears stated age Head: Normocephalic, without obvious  abnormality, atraumatic Eyes: conjunctivae/corneas clear. PERRL, EOM's intact. Fundi benign. Ears: normal TM's and external ear canals both ears Nose: Nares normal. Septum midline. Mucosa normal. No drainage or sinus tenderness. Throat: lips, mucosa, and tongue normal; teeth and gums normal Neck: no adenopathy, no carotid bruit, no JVD, supple, symmetrical, trachea midline, and thyroid not enlarged, symmetric, no tenderness/mass/nodules Back: symmetric, no curvature. ROM normal. No CVA tenderness. Lungs: clear to auscultation bilaterally Heart: regular rate and rhythm, S1, S2 normal, no murmur, click, rub or gallop Abdomen: soft, non-tender; bowel sounds normal; no masses,  no organomegaly Extremities: extremities normal, atraumatic, no cyanosis or edema Pulses: 2+ and symmetric Skin: Skin color, texture, turgor normal. No rashes or lesions Lymph nodes: Cervical, supraclavicular, and axillary nodes normal. Neurologic: Grossly normal   .. Depression screen [facility] 2/9 09/09/2021 08/05/2020 07/19/2019 10/31/2018 07/13/2018  Decreased Interest 0 0 0 0 0  Down, Depressed, Hopeless 0 1 0 0 0  PHQ - 2 Score 0 1 0 0 0  Altered sleeping - 1 0 0 -  Tired, decreased energy - 0 0 - -  Change in appetite - 0 0 0 -  Feeling bad or failure about yourself  - 0 0 0 -  Trouble concentrating - 0 0 0 -  Moving slowly or fidgety/restless - 0 0 0 -  Suicidal thoughts - 0 0 0 -  PHQ-9 Score - 2 0 0 -  Difficult doing work/chores - Not difficult at all Not difficult at all Not difficult at all -    Assessment:    Healthy female exam.      Plan:  Marland KitchenMarland KitchenJennalyn was seen today for annual exam.  Diagnoses and all orders for this visit:  Routine physical examination  Age related osteoporosis, unspecified pathological fracture presence  . Discussed 150 minutes of exercise a week.  Encouraged vitamin D 1000 units and Calcium 1300mg  or 4 servings of dairy a day.  PHQ no concerns.  Fasting labs ordered. Mammogram  UTD. No need for pap.  Colonoscopy UTD.  Shingrix UTD.  Covid x3. Needs booster.  Declined flu and pneumonia vaccine.   Discussed osteoporosis on DEXA 12/2020.  Discussed risk of not treating.  Pt is going to continue to try lifestyle management.  She declines medication.  She is on vitamin D and calcium.  HO given.  Recheck in 2024.     See After Visit Summary for Counseling Recommendations

## 2021-09-10 LAB — LIPID PANEL W/REFLEX DIRECT LDL
Cholesterol: 219 mg/dL — ABNORMAL HIGH (ref <200)
HDL: 135 mg/dL (ref >=50)
LDL Cholesterol (Calc): 70 mg/dL (calc)
Non-HDL Cholesterol (Calc): 84 mg/dL (calc) (ref <130)
Total CHOL/HDL Ratio: 1.6 (calc) (ref <5.0)
Triglycerides: 49 mg/dL (ref <150)

## 2021-09-10 LAB — TSH: TSH: 1.67 mIU/L (ref 0.40–4.50)

## 2021-09-10 LAB — CBC WITH DIFFERENTIAL/PLATELET
Absolute Monocytes: 273 cells/uL (ref 200–950)
Basophils Absolute: 31 cells/uL (ref 0–200)
Basophils Relative: 1.2 %
Eosinophils Absolute: 49 cells/uL (ref 15–500)
Eosinophils Relative: 1.9 %
HCT: 43.5 % (ref 35.0–45.0)
Hemoglobin: 13.7 g/dL (ref 11.7–15.5)
Lymphs Abs: 1199 cells/uL (ref 850–3900)
MCH: 29 pg (ref 27.0–33.0)
MCHC: 31.5 g/dL — ABNORMAL LOW (ref 32.0–36.0)
MCV: 92.2 fL (ref 80.0–100.0)
MPV: 12.2 fL (ref 7.5–12.5)
Monocytes Relative: 10.5 %
Neutro Abs: 1048 cells/uL — ABNORMAL LOW (ref 1500–7800)
Neutrophils Relative %: 40.3 %
Platelets: 248 10*3/uL (ref 140–400)
RBC: 4.72 10*6/uL (ref 3.80–5.10)
RDW: 12.6 % (ref 11.0–15.0)
Total Lymphocyte: 46.1 %
WBC: 2.6 10*3/uL — ABNORMAL LOW (ref 3.8–10.8)

## 2021-09-10 LAB — COMPLETE METABOLIC PANEL WITH GFR
AG Ratio: 1.5 (calc) (ref 1.0–2.5)
ALT: 14 U/L (ref 6–29)
AST: 21 U/L (ref 10–35)
Albumin: 4.4 g/dL (ref 3.6–5.1)
Alkaline phosphatase (APISO): 53 U/L (ref 37–153)
BUN: 11 mg/dL (ref 7–25)
CO2: 29 mmol/L (ref 20–32)
Calcium: 9.5 mg/dL (ref 8.6–10.4)
Chloride: 103 mmol/L (ref 98–110)
Creat: 0.95 mg/dL (ref 0.50–1.05)
Globulin: 2.9 g/dL (calc) (ref 1.9–3.7)
Glucose, Bld: 79 mg/dL (ref 65–99)
Potassium: 5.3 mmol/L (ref 3.5–5.3)
Sodium: 140 mmol/L (ref 135–146)
Total Bilirubin: 0.8 mg/dL (ref 0.2–1.2)
Total Protein: 7.3 g/dL (ref 6.1–8.1)
eGFR: 66 mL/min/{1.73_m2} (ref >=60)

## 2021-09-10 NOTE — Progress Notes (Signed)
Berklee,   Thyroid looks great.  LDL and HDL look fabulous.  Kidney, liver, glucose look great! WBC low but stable from years past.  Hemoglobin, RBC, platelets still look great.

## 2021-09-13 ENCOUNTER — Encounter: Payer: Self-pay | Admitting: Physician Assistant

## 2021-10-06 ENCOUNTER — Ambulatory Visit: Payer: 59 | Admitting: Physician Assistant

## 2021-11-01 ENCOUNTER — Encounter: Payer: Self-pay | Admitting: Physician Assistant

## 2021-11-01 ENCOUNTER — Other Ambulatory Visit: Payer: Self-pay

## 2021-11-01 ENCOUNTER — Ambulatory Visit (INDEPENDENT_AMBULATORY_CARE_PROVIDER_SITE_OTHER): Payer: 59 | Admitting: Physician Assistant

## 2021-11-01 ENCOUNTER — Other Ambulatory Visit: Payer: Self-pay | Admitting: Physician Assistant

## 2021-11-01 VITALS — BP 130/45 | HR 55 | Ht 64.0 in | Wt 146.9 lb

## 2021-11-01 DIAGNOSIS — R3915 Urgency of urination: Secondary | ICD-10-CM | POA: Diagnosis not present

## 2021-11-01 DIAGNOSIS — R350 Frequency of micturition: Secondary | ICD-10-CM

## 2021-11-01 DIAGNOSIS — R195 Other fecal abnormalities: Secondary | ICD-10-CM

## 2021-11-01 DIAGNOSIS — N3001 Acute cystitis with hematuria: Secondary | ICD-10-CM

## 2021-11-01 LAB — POCT URINALYSIS DIP (CLINITEK)
Bilirubin, UA: NEGATIVE
Glucose, UA: NEGATIVE mg/dL
Ketones, POC UA: NEGATIVE mg/dL
Nitrite, UA: NEGATIVE
POC PROTEIN,UA: NEGATIVE
Spec Grav, UA: 1.02 (ref 1.010–1.025)
Urobilinogen, UA: 0.2 E.U./dL
pH, UA: 6.5 (ref 5.0–8.0)

## 2021-11-01 MED ORDER — NITROFURANTOIN MONOHYD MACRO 100 MG PO CAPS: 100.0000 mg | ORAL_CAPSULE | Freq: Two times a day (BID) | ORAL | 0 refills | Status: DC

## 2021-11-01 NOTE — Progress Notes (Addendum)
Established Patient Office Visit  Subjective:  Patient ID: Casey Castaneda, female    DOB: 09/17/1956  Age: 65 y.o. MRN: [id number]  CC: Possible UTI  HPI 65 y.o female presenting to the clinic for urinary urgency and frequency that began 5 days ago. Reports she is additionally having crampy, intermittent LLQ pain with associated diarrhea. Reports that her husband had bouts of diarrhea last week but also states that her diarrhea may be attributed to her increased intake of pure cranberry juice. She is positive for body aches but negative for hematuria, dysuria, flank pain, hematochezia, or fever.   Past Medical History:  Diagnosis Date   Abnormal Pap smear of vagina    Neck pain    Shoulder pain     Past Surgical History:  Procedure Laterality Date   CESAREAN SECTION      Family History  Problem Relation Age of Onset   Leukemia Mother    Hypertension Father     Social History   Socioeconomic History   Marital status: Married    Spouse name: Not on file   Number of children: Not on file   Years of education: Not on file   Highest education level: Not on file  Occupational History   Not on file  Tobacco Use   Smoking status: Former   Smokeless tobacco: Never  Vaping Use   Vaping Use: Never used  Substance and Sexual Activity   Alcohol use: Yes    Comment: red wine 4 q wk   Drug use: No   Sexual activity: Not on file  Other Topics Concern   Not on file  Social History Narrative   Not on file   Social Determinants of Health   Financial Resource Strain: Not on file  Food Insecurity: Not on file  Transportation Needs: Not on file  Physical Activity: Not on file  Stress: Not on file  Social Connections: Not on file  Intimate Partner Violence: Not on file    Outpatient Medications Prior to Visit  Medication Sig Dispense Refill   calcium citrate-vitamin D (CITRACAL+D) 315-200 MG-UNIT tablet Take 1 tablet by mouth 2 (two) times daily.     Multiple Vitamin  (MULTIVITAMIN) capsule Take by mouth.     No facility-administered medications prior to visit.    Allergies  Allergen Reactions   Sulfa Antibiotics     ROS Review of Systems   See HPI.  Objective:    Physical Exam Vitals reviewed.  Constitutional:      Appearance: Normal appearance.  HENT:     Head: Normocephalic.  Cardiovascular:     Rate and Rhythm: Normal rate and regular rhythm.  Pulmonary:     Effort: Pulmonary effort is normal.     Breath sounds: Normal breath sounds.  Abdominal:     General: Bowel sounds are normal. There is no distension.     Palpations: Abdomen is soft. There is no mass.     Tenderness: There is abdominal tenderness. There is no right CVA tenderness, left CVA tenderness, guarding or rebound.     Hernia: No hernia is present.     Comments: Very mild tenderness to palpation in the suprapubic and left lower quadrant. No guarding.   Neurological:     General: No focal deficit present.     Mental Status: She is alert and oriented to person, place, and time.  Psychiatric:        Mood and Affect: Mood normal.    BP [name])  130/45   Pulse (!) 55   Ht 5\' 4"  (1.626 m)   Wt 146 lb 14.4 oz (66.6 kg)   SpO2 100%   BMI 25.22 kg/m  Wt Readings from Last 3 Encounters:  11/01/21 146 lb 14.4 oz (66.6 kg)  09/09/21 147 lb (66.7 kg)  11/27/19 148 lb (67.1 kg)     Health Maintenance Due  Topic Date Due   Pneumonia Vaccine 39+ Years old (1 - PCV) Never done   COVID-19 Vaccine (4 - Booster for Pfizer series) 12/07/2020   .12/09/2020 Results for orders placed or performed in visit on 11/01/21  POCT URINALYSIS DIP (CLINITEK)  Result Value Ref Range   Color, UA yellow yellow   Clarity, UA clear clear   Glucose, UA negative negative mg/dL   Bilirubin, UA negative negative   Ketones, POC UA negative negative mg/dL   Spec Grav, UA 11/03/21 2.353 - 1.025   Blood, UA trace-intact (A) negative   pH, UA 6.5 5.0 - 8.0   POC PROTEIN,UA negative negative, trace    Urobilinogen, UA 0.2 0.2 or 1.0 E.U./dL   Nitrite, UA Negative Negative   Leukocytes, UA Moderate (2+) (A) Negative    Assessment & Plan:  6.144Marland KitchenDiagnoses and all orders for this visit:  Acute cystitis with hematuria -     Urine Culture; Future -     nitrofurantoin, macrocrystal-monohydrate, (MACROBID) 100 MG capsule; Take 1 capsule (100 mg total) by mouth 2 (two) times daily. -     POCT URINALYSIS DIP (CLINITEK)  Loose stools  Urinary urgency  Urinary frequency   ? If cranberry juice or GI viral bug started diarrhea.  UA positive for leuks and blood.  Sent for culture.  Treated for acute cystitis due to symptoms with macrobid.  Stop cranberry juice if causing loose stools.  Keep BRAT diet.  Follow up as needed or if symptoms worsen.   Follow-up: Return if symptoms worsen or fail to improve.    [name], PA-C

## 2021-11-01 NOTE — Patient Instructions (Signed)

## 2021-11-02 ENCOUNTER — Encounter: Payer: Self-pay | Admitting: Physician Assistant

## 2021-11-03 LAB — URINE CULTURE
MICRO NUMBER:: 12667348
SPECIMEN QUALITY:: ADEQUATE

## 2021-11-03 LAB — HOUSE ACCOUNT TRACKING

## 2021-11-03 NOTE — Progress Notes (Signed)
Confirmed UTI and bacteria in culture. Current macrobid treatment should treat. Please let us know if not feeling any better.

## 2022-01-31 ENCOUNTER — Telehealth: Payer: Self-pay | Admitting: Neurology

## 2022-01-31 NOTE — Telephone Encounter (Signed)
Called patient to schedule appt and she stated she took some Mucinex this morning & she seems to be feeling better and did not want to schedule at this time. AM

## 2022-01-31 NOTE — Telephone Encounter (Signed)
Patient left vm stating she tested positive for Covid with symptoms starting yesterday. She is wanting to know about medications.   Can we call patient to schedule a virtual appt please?

## 2022-05-12 ENCOUNTER — Telehealth: Payer: Self-pay

## 2022-05-12 MED ORDER — AZELASTINE HCL 0.1 % NA SOLN: 2.0000 | Freq: Two times a day (BID) | NASAL | 2 refills | Status: AC

## 2022-05-12 NOTE — Telephone Encounter (Signed)
Ask about flonase or astelin or atrovent?

## 2022-09-12 ENCOUNTER — Encounter: Payer: 59 | Admitting: Physician Assistant

## 2022-09-12 ENCOUNTER — Encounter: Payer: Self-pay | Admitting: Physician Assistant

## 2022-09-12 ENCOUNTER — Ambulatory Visit (INDEPENDENT_AMBULATORY_CARE_PROVIDER_SITE_OTHER): Payer: 59 | Admitting: Physician Assistant

## 2022-09-12 VITALS — BP 125/68 | HR 59 | Temp 98.6°F | Ht 64.0 in | Wt 144.0 lb

## 2022-09-12 DIAGNOSIS — Z0000 Encounter for general adult medical examination without abnormal findings: Secondary | ICD-10-CM

## 2022-09-12 DIAGNOSIS — Z13820 Encounter for screening for osteoporosis: Secondary | ICD-10-CM

## 2022-09-12 DIAGNOSIS — Z1329 Encounter for screening for other suspected endocrine disorder: Secondary | ICD-10-CM | POA: Diagnosis not present

## 2022-09-12 DIAGNOSIS — Z131 Encounter for screening for diabetes mellitus: Secondary | ICD-10-CM

## 2022-09-12 DIAGNOSIS — Z23 Encounter for immunization: Secondary | ICD-10-CM

## 2022-09-12 DIAGNOSIS — Z13220 Encounter for screening for lipoid disorders: Secondary | ICD-10-CM

## 2022-09-12 DIAGNOSIS — Z1231 Encounter for screening mammogram for malignant neoplasm of breast: Secondary | ICD-10-CM

## 2022-09-12 DIAGNOSIS — Z780 Asymptomatic menopausal state: Secondary | ICD-10-CM

## 2022-09-12 NOTE — Progress Notes (Signed)
Complete physical exam  Patient: Casey Castaneda   DOB: [birth year]   66 y.o. Female  MRN: [id number]  Subjective:    Chief Complaint  Patient presents with   Annual Exam    Casey Castaneda is a 66 y.o. female who presents today for a complete physical exam. She reports consuming a general diet.  Yoga and walking and strength training.   She generally feels well. She reports sleeping well. She does not have additional problems to discuss today.    Most recent fall risk assessment:    09/12/2022    9:50 AM  Casey Castaneda in the past year? 0  Number falls in past yr: 0  Injury with Fall? 0  Risk for fall due to : No Fall Risks  Follow up Falls evaluation completed     Most recent depression screenings:    09/12/2022    9:50 AM 09/09/2021    2:29 PM  PHQ 2/9 Scores  PHQ - 2 Score 0 0  PHQ- 9 Score 0     Vision:Within last year and Dental: No current dental problems  Patient Active Problem List   Diagnosis Date Noted   Osteoporosis 12/23/2020   Seasonal allergies 08/07/2020   Impingement syndrome, shoulder, left 11/27/2019   DDD (degenerative disc disease), cervical 11/27/2019   Elevated blood pressure reading 10/31/2018   Serum potassium elevated 07/13/2018   Leukopenia 07/13/2018   Muscle tightness 07/13/2018   Neck pain 07/13/2018   Primary osteoarthritis of left knee 06/04/2018   Past Medical History:  Diagnosis Date   Abnormal Pap smear of vagina    Neck pain    Shoulder pain    Family History  Problem Relation Age of Onset   Leukemia Mother    Hypertension Father    Allergies  Allergen Reactions   Sulfa Antibiotics       Patient Care Team: Casey Castaneda as PCP - General (Family Medicine)   Outpatient Medications Prior to Visit  Medication Sig   azelastine (ASTELIN) 0.1 % nasal spray Place 2 sprays into both nostrils 2 (two) times daily. Use in each nostril as directed   calcium citrate-vitamin D (CITRACAL+D) 315-200 MG-UNIT tablet Take  1 tablet by mouth 2 (two) times daily.   Multiple Vitamin (MULTIVITAMIN) capsule Take by mouth.   No facility-administered medications prior to visit.    Review of Systems  All other systems reviewed and are negative.         Objective:     BP 125/68   Pulse (!) 59   Temp 98.6 F (37 C)   Ht 5\' 4"  (1.626 m)   Wt 144 lb (65.3 kg)   SpO2 100%   BMI 24.72 kg/m  BP Readings from Last 3 Encounters:  09/12/22 125/68  11/01/21 (!) 130/45  09/09/21 133/68   Wt Readings from Last 3 Encounters:  09/12/22 144 lb (65.3 kg)  11/01/21 146 lb 14.4 oz (66.6 kg)  09/09/21 147 lb (66.7 kg)      Physical Exam     BP 125/68   Pulse (!) 59   Temp 98.6 F (37 C)   Ht 5\' 4"  (1.626 m)   Wt 144 lb (65.3 kg)   SpO2 100%   BMI 24.72 kg/m   General Appearance:    Alert, cooperative, no distress, appears stated age  Head:    Normocephalic, without obvious abnormality, atraumatic  Eyes:    PERRL, conjunctiva/corneas clear, EOM's intact, fundi  benign, both eyes  Ears:    Normal TM's and external ear canals, both ears  Nose:   Nares normal, septum midline, mucosa normal, no drainage    or sinus tenderness  Throat:   Lips, mucosa, and tongue normal; teeth and gums normal  Neck:   Supple, symmetrical, trachea midline, no adenopathy;    thyroid:  no enlargement/tenderness/nodules; no carotid   bruit or JVD  Back:     Symmetric, no curvature, ROM normal, no CVA tenderness  Lungs:     Clear to auscultation bilaterally, respirations unlabored  Chest Wall:    No tenderness or deformity   Heart:    Regular rate and rhythm, S1 and S2 normal, no murmur, rub   or gallop     Abdomen:     Soft, non-tender, bowel sounds active all four quadrants,    no masses, no organomegaly        Extremities:   Extremities normal, atraumatic, no cyanosis or edema  Pulses:   2+ and symmetric all extremities  Skin:   Skin color, texture, turgor normal, no rashes or lesions  Lymph nodes:   Cervical,  supraclavicular, and axillary nodes normal  Neurologic:   CNII-XII intact, normal strength, sensation and reflexes    throughout   Assessment & Plan:    Routine Health Maintenance and Physical Exam  Immunization History  Administered Date(s) Administered   Fluad Quad(high Dose 65+) 09/12/2022   Influenza,inj,Quad PF,6+ Mos 09/03/2019   Influenza-Unspecified 09/15/2021   PFIZER(Purple Top)SARS-COV-2 Vaccination 03/12/2020, 04/01/2020, 10/12/2020   Zoster Recombinat (Shingrix) 07/19/2019, 10/14/2019    Health Maintenance  Topic Date Due   Pneumonia Vaccine 55+ Years old (1 - PCV) 09/13/2023 (Originally 08/26/2020)   COVID-19 Vaccine (4 - Pfizer series) 09/29/2023 (Originally 12/07/2020)   MAMMOGRAM  12/23/2022   COLONOSCOPY (Pts 45-52yrs Insurance coverage will need to be confirmed)  12/13/2023   TETANUS/TDAP  12/12/2024   INFLUENZA VACCINE  Completed   DEXA SCAN  Completed   Hepatitis C Screening  Completed   Zoster Vaccines- Shingrix  Completed   HPV VACCINES  Aged Out    Discussed health benefits of physical activity, and encouraged her to engage in regular exercise appropriate for her age and condition  .[name] Discussed 150 minutes of exercise a week.  Encouraged vitamin D 1000 units and Calcium 1300mg  or 4 servings of dairy a day.  PHQ no concerns Fasting labs ordered Mammogram UTD Dexa UTD Pap not indicated Colonoscopy UTD Shingles/flu UTD Needs pneumonia and covid booster Need yearly eye exam Follow up in 1 year     , PA-C

## 2022-09-12 NOTE — Patient Instructions (Signed)

## 2022-09-13 LAB — LIPID PANEL W/REFLEX DIRECT LDL
Cholesterol: 190 mg/dL (ref <200)
HDL: 81 mg/dL (ref >=50)
LDL Cholesterol (Calc): 96 mg/dL (calc)
Non-HDL Cholesterol (Calc): 109 mg/dL (calc) (ref <130)
Total CHOL/HDL Ratio: 2.3 (calc) (ref <5.0)
Triglycerides: 43 mg/dL (ref <150)

## 2022-09-13 LAB — COMPLETE METABOLIC PANEL WITH GFR
AG Ratio: 1.5 (calc) (ref 1.0–2.5)
ALT: 14 U/L (ref 6–29)
AST: 18 U/L (ref 10–35)
Albumin: 4.5 g/dL (ref 3.6–5.1)
Alkaline phosphatase (APISO): 45 U/L (ref 37–153)
BUN: 18 mg/dL (ref 7–25)
CO2: 26 mmol/L (ref 20–32)
Calcium: 9.4 mg/dL (ref 8.6–10.4)
Chloride: 107 mmol/L (ref 98–110)
Creat: 0.87 mg/dL (ref 0.50–1.05)
Globulin: 3 g/dL (calc) (ref 1.9–3.7)
Glucose, Bld: 90 mg/dL (ref 65–99)
Potassium: 5.6 mmol/L — ABNORMAL HIGH (ref 3.5–5.3)
Sodium: 142 mmol/L (ref 135–146)
Total Bilirubin: 0.6 mg/dL (ref 0.2–1.2)
Total Protein: 7.5 g/dL (ref 6.1–8.1)
eGFR: 73 mL/min/{1.73_m2} (ref >=60)

## 2022-09-13 LAB — CBC WITH DIFFERENTIAL/PLATELET
Absolute Monocytes: 244 cells/uL (ref 200–950)
Basophils Absolute: 20 cells/uL (ref 0–200)
Basophils Relative: 0.7 %
Eosinophils Absolute: 32 cells/uL (ref 15–500)
Eosinophils Relative: 1.1 %
HCT: 39 % (ref 35.0–45.0)
Hemoglobin: 12.7 g/dL (ref 11.7–15.5)
Lymphs Abs: 1018 cells/uL (ref 850–3900)
MCH: 28.4 pg (ref 27.0–33.0)
MCHC: 32.6 g/dL (ref 32.0–36.0)
MCV: 87.2 fL (ref 80.0–100.0)
MPV: 11.9 fL (ref 7.5–12.5)
Monocytes Relative: 8.4 %
Neutro Abs: 1586 cells/uL (ref 1500–7800)
Neutrophils Relative %: 54.7 %
Platelets: 248 10*3/uL (ref 140–400)
RBC: 4.47 10*6/uL (ref 3.80–5.10)
RDW: 12.5 % (ref 11.0–15.0)
Total Lymphocyte: 35.1 %
WBC: 2.9 10*3/uL — ABNORMAL LOW (ref 3.8–10.8)

## 2022-09-13 LAB — TSH: TSH: 0.69 mIU/L (ref 0.40–4.50)

## 2022-09-13 LAB — VITAMIN D 25 HYDROXY (VIT D DEFICIENCY, FRACTURES): Vit D, 25-Hydroxy: 41 ng/mL (ref 30–100)

## 2022-09-13 NOTE — Progress Notes (Signed)
[place],   Vitamin D is good.  Cholesterol is great.  Thyroid is good.  Kidney, liver, glucose look good.   Your potassium was a little elevated. Look in supplements for any extra potassium and stop taking.  Repeat labs in 2 weeks.   WBC still low but stable for 2 plus years. No other abnormal findings in CBC.

## 2022-10-06 ENCOUNTER — Ambulatory Visit (INDEPENDENT_AMBULATORY_CARE_PROVIDER_SITE_OTHER): Payer: 59

## 2022-10-06 DIAGNOSIS — Z1231 Encounter for screening mammogram for malignant neoplasm of breast: Secondary | ICD-10-CM

## 2022-10-10 NOTE — Progress Notes (Signed)
Normal mammogram. Follow up in 1 year.

## 2023-01-18 ENCOUNTER — Ambulatory Visit (INDEPENDENT_AMBULATORY_CARE_PROVIDER_SITE_OTHER): Payer: 59

## 2023-01-18 DIAGNOSIS — Z780 Asymptomatic menopausal state: Secondary | ICD-10-CM | POA: Diagnosis not present

## 2023-01-18 DIAGNOSIS — Z13820 Encounter for screening for osteoporosis: Secondary | ICD-10-CM

## 2023-01-18 NOTE — Progress Notes (Signed)
Dexa did not change but did not improve. Still in osteoporosis range. Would qualify for medications for osteoporosis. Thoughts? I know you declined them 2 years ago.

## 2023-07-27 ENCOUNTER — Ambulatory Visit: Payer: 59 | Admitting: Medical-Surgical

## 2023-07-27 ENCOUNTER — Encounter: Payer: Self-pay | Admitting: Medical-Surgical

## 2023-07-27 VITALS — BP 139/83 | HR 63 | Ht 64.0 in | Wt 148.1 lb

## 2023-07-27 DIAGNOSIS — L509 Urticaria, unspecified: Secondary | ICD-10-CM

## 2023-07-27 MED ORDER — PREDNISONE 10 MG (48) PO TBPK: ORAL_TABLET | Freq: Every day | ORAL | 0 refills | Status: DC

## 2023-07-27 MED ORDER — FAMOTIDINE 20 MG PO TABS: 20.0000 mg | ORAL_TABLET | Freq: Two times a day (BID) | ORAL | 2 refills | Status: AC

## 2023-07-27 NOTE — Progress Notes (Signed)
        Established patient visit  History, exam, impression, and plan:  1. Urticaria Pleasant 67 year old female presenting today with reports of a history of recurrent hives.  Notes that her most sent issue started several days ago.  She believes that this started when she had some seafood that her spouse brought from the grocery store.  She noted that the texture and taste was a bit off but was able to eat without any GI issues.  Shortly after, she developed full body hives that were very itchy and bothersome.  Notes that the hives have developed a pattern of getting worse at 5 PM and lasting throughout the night.  The itching is waking her at night and she is having difficulty getting rest.  During the day, her hives improved greatly although she does have some residual itching.  Has tried taking Allegra over-the-counter however this has not been helpful.  Notes allergy testing completed many years ago showing that she was allergic to multiple agents.  Has not tried any topical ointments or Benadryl.  On evaluation, no visible hives today however she does have a few areas of redness.  Discussed recommendations for management.  Okay to continue Allegra if desired.  Start famotidine 20 mg twice daily as needed.  Adding prednisone taper x 12 days.  She would like to give the Allegra and famotidine 2 to 3 days to see if this helps her symptoms and if not, we will plan to start the prednisone then.   Procedures performed this visit: None.  Return if symptoms worsen or fail to improve.  __________________________________ Thayer Ohm, DNP, APRN, FNP-BC Primary Care and Sports Medicine [facility]

## 2023-08-21 ENCOUNTER — Ambulatory Visit: Payer: 59 | Admitting: Family Medicine

## 2023-08-21 ENCOUNTER — Encounter: Payer: Self-pay | Admitting: Family Medicine

## 2023-08-21 VITALS — BP 139/70 | HR 90 | Temp 98.0°F | Resp 18 | Ht 64.0 in | Wt 148.6 lb

## 2023-08-21 DIAGNOSIS — L509 Urticaria, unspecified: Secondary | ICD-10-CM | POA: Insufficient documentation

## 2023-08-21 MED ORDER — HYDROXYZINE PAMOATE 25 MG PO CAPS: 25.0000 mg | ORAL_CAPSULE | Freq: Three times a day (TID) | ORAL | 0 refills | Status: DC | PRN

## 2023-08-21 NOTE — Progress Notes (Signed)
Established Patient Office Visit  Subjective   Patient ID: Casey Castaneda, female    DOB: 09-18-1956  Age: 67 y.o. MRN: [id number]  Chief Complaint  Patient presents with   Urticaria    Patient states that she has been having hives since Thursday of last week she states that she did start the prednisone , She states that it seems like it wears out at night     HPI  Presents today for an acute visit with complaint of ongoing hives, saw Larinda Buttery, NP 07/27/23,  was given Pepcid and allergy medication which helped until this past Thursday. Did not start prednisone taper at that time. Started this on Friday. Had a stressful day on Thursday- wonders if this triggered the symptoms.  Symptoms have been present  past Thursday/Friday.  Associated symptoms include: itching at night that is worse at night, manageable during the day.  Pertinent negatives: no shortness of breath, no facial swelling.  Pain severity: 0/10  Treatments tried include : prednisone taper day # 4  Treatment effective : helping during the day, night is miserable  Will discuss with PCP about allergist. Declines referral today. Has seen allergist in the past and has multiple allergies. Thinks this is related to eating the skin of salmon in August.   Chart review: 07/27/23: pepcid and prednisone taper given. Did  not start prednisone until 4 days ago.    Review of Systems  Respiratory:  Negative for shortness of breath.        No facial swelling.   Skin:  Positive for itching and rash.      Objective:     BP 139/70   Pulse 90   Temp 98 F (36.7 C) (Oral)   Resp 18   Ht 5\' 4"  (1.626 m)   Wt 148 lb 9.6 oz (67.4 kg)   SpO2 (!) 18%   BMI 25.51 kg/m    Physical Exam Vitals and nursing note reviewed.  Constitutional:      General: She is not in acute distress.    Appearance: Normal appearance.  Pulmonary:     Effort: Pulmonary effort is normal.  Skin:    General: Skin is warm and dry.     Capillary Refill:  Capillary refill takes less than 2 seconds.     Comments: Non raised erythematous  area on left hand,  right  and left  knee. Symptoms are worse at night.   Neurological:     General: No focal deficit present.     Mental Status: She is alert. Mental status is at baseline.  Psychiatric:        Mood and Affect: Mood normal.        Behavior: Behavior normal.        Thought Content: Thought content normal.        Judgment: Judgment normal.     No results found for any visits on 08/21/23.    The 10-year ASCVD risk score (Arnett DK, et al., 2019) is: 10.2%    Assessment & Plan:   Problem List Items Addressed This Visit     Urticaria of unknown origin - Primary    Day # 4 of prednisone taper. Will add hydroxyzine 25 mg for HS. Understands to avoid taking if she is going to drive.  Minimal rash present today in clinic, reports symptoms worse at night.  She has appointment with PCP on 09/15/23 will discuss referral to allergy specialist at that visit.  Relevant Medications   hydrOXYzine (VISTARIL) 25 MG capsule  Agrees with plan of care discussed.  Questions answered.   Return if symptoms worsen or fail to improve.    Novella Olive, FNP

## 2023-08-21 NOTE — Assessment & Plan Note (Addendum)
Day # 4 of prednisone taper. Will add hydroxyzine 25 mg for HS. Understands to avoid taking if she is going to drive.  Minimal rash present today in clinic, reports symptoms worse at night.  She has appointment with PCP on 09/15/23 will discuss referral to allergy specialist at that visit.

## 2023-09-15 ENCOUNTER — Encounter: Payer: Self-pay | Admitting: Physician Assistant

## 2023-09-15 ENCOUNTER — Ambulatory Visit (INDEPENDENT_AMBULATORY_CARE_PROVIDER_SITE_OTHER): Payer: 59 | Admitting: Physician Assistant

## 2023-09-15 VITALS — BP 132/72 | HR 67 | Ht 64.0 in | Wt 148.0 lb

## 2023-09-15 DIAGNOSIS — Z131 Encounter for screening for diabetes mellitus: Secondary | ICD-10-CM

## 2023-09-15 DIAGNOSIS — Z13220 Encounter for screening for lipoid disorders: Secondary | ICD-10-CM

## 2023-09-15 DIAGNOSIS — Z780 Asymptomatic menopausal state: Secondary | ICD-10-CM

## 2023-09-15 DIAGNOSIS — Z23 Encounter for immunization: Secondary | ICD-10-CM

## 2023-09-15 DIAGNOSIS — R030 Elevated blood-pressure reading, without diagnosis of hypertension: Secondary | ICD-10-CM | POA: Diagnosis not present

## 2023-09-15 DIAGNOSIS — Z0000 Encounter for general adult medical examination without abnormal findings: Secondary | ICD-10-CM | POA: Diagnosis not present

## 2023-09-15 NOTE — Progress Notes (Signed)
Complete physical exam  Patient: Casey Castaneda   DOB: 10-02-1956   67 y.o. Female  MRN: [id number]  Subjective:    Chief Complaint  Patient presents with   Annual Exam    Annual physical  Fasting labs     Casey Castaneda is a 67 y.o. female who presents today for a complete physical exam. She reports consuming a general diet.  She walks regularly.   She generally feels well. She reports sleeping well. She does not have additional problems to discuss today.    Most recent fall risk assessment:    07/27/2023   10:49 AM  Fall Risk   Falls in the past year? 0  Number falls in past yr: 0  Injury with Fall? 0  Risk for fall due to : No Fall Risks  Follow up Falls evaluation completed     Most recent depression screenings:    07/27/2023   10:49 AM 09/12/2022    9:50 AM  PHQ 2/9 Scores  PHQ - 2 Score 0 0  PHQ- 9 Score  0    Vision:Within last year and Dental: No current dental problems and Receives regular dental care  Patient Active Problem List   Diagnosis Date Noted   Urticaria of unknown origin 08/21/2023   Osteoporosis 12/23/2020   Seasonal allergies 08/07/2020   Impingement syndrome, shoulder, left 11/27/2019   DDD (degenerative disc disease), cervical 11/27/2019   Elevated blood pressure reading 10/31/2018   Serum potassium elevated 07/13/2018   Leukopenia 07/13/2018   Muscle tightness 07/13/2018   Neck pain 07/13/2018   Primary osteoarthritis of left knee 06/04/2018   Past Medical History:  Diagnosis Date   Abnormal Pap smear of vagina    Neck pain    Shoulder pain    Past Surgical History:  Procedure Laterality Date   CESAREAN SECTION     Family History  Problem Relation Age of Onset   Leukemia Mother    Hypertension Father    Allergies  Allergen Reactions   Sulfa Antibiotics       Patient Care Team: Casey Castaneda as PCP - General (Family Medicine)   Outpatient Medications Prior to Visit  Medication Sig   azelastine (ASTELIN) 0.1 %  nasal spray Place 2 sprays into both nostrils 2 (two) times daily. Use in each nostril as directed   calcium citrate-vitamin D (CITRACAL+D) 315-200 MG-UNIT tablet Take 1 tablet by mouth 2 (two) times daily.   famotidine (PEPCID) 20 MG tablet Take 1 tablet (20 mg total) by mouth 2 (two) times daily.   hydrOXYzine (VISTARIL) 25 MG capsule Take 1 capsule (25 mg total) by mouth every 8 (eight) hours as needed for itching.   Multiple Vitamin (MULTIVITAMIN) capsule Take by mouth.   Multiple Vitamin (MULTIVITAMIN) capsule Take 1 capsule by mouth daily.   predniSONE (STERAPRED UNI-PAK 48 TAB) 10 MG (48) TBPK tablet Take by mouth daily. 12-Day taper, po   No facility-administered medications prior to visit.    Review of Systems  All other systems reviewed and are negative.         Objective:     There were no vitals taken for this visit. BP Readings from Last 3 Encounters:  08/21/23 139/70  07/27/23 139/83  09/12/22 125/68   Wt Readings from Last 3 Encounters:  08/21/23 148 lb 9.6 oz (67.4 kg)  07/27/23 148 lb 1.3 oz (67.2 kg)  09/12/22 144 lb (65.3 kg)      Physical Exam  BP (!) 155/70   Pulse 67   Ht 5\' 4"  (1.626 m)   Wt 148 lb (67.1 kg)   SpO2 99%   BMI 25.40 kg/m   General Appearance:    Alert, cooperative, no distress, appears stated age  Head:    Normocephalic, without obvious abnormality, atraumatic  Eyes:    PERRL, conjunctiva/corneas clear, EOM's intact, fundi    benign, both eyes  Ears:    Normal TM's and external ear canals, both ears  Nose:   Nares normal, septum midline, mucosa normal, no drainage    or sinus tenderness  Throat:   Lips, mucosa, and tongue normal; teeth and gums normal  Neck:   Supple, symmetrical, trachea midline, no adenopathy;    thyroid:  no enlargement/tenderness/nodules; no carotid   bruit or JVD  Back:     Symmetric, no curvature, ROM normal, no CVA tenderness  Lungs:     Clear to auscultation bilaterally, respirations unlabored   Chest Wall:    No tenderness or deformity   Heart:    Regular rate and rhythm, S1 and S2 normal, no murmur, rub   or gallop     Abdomen:     Soft, non-tender, bowel sounds active all four quadrants,    no masses, no organomegaly        Extremities:   Extremities normal, atraumatic, no cyanosis or edema  Pulses:   2+ and symmetric all extremities  Skin:   Skin color, texture, turgor normal, no rashes or lesions  Lymph nodes:   Cervical, supraclavicular, and axillary nodes normal  Neurologic:   CNII-XII intact, normal strength, sensation and reflexes    throughout   Assessment & Plan:    Routine Health Maintenance and Physical Exam  Immunization History  Administered Date(s) Administered   Fluad Quad(high Dose 65+) 09/12/2022   Fluad Trivalent(High Dose 65+) 09/15/2023   Influenza,inj,Quad PF,6+ Mos 09/03/2019   Influenza-Unspecified 09/15/2021   PFIZER(Purple Top)SARS-COV-2 Vaccination 03/12/2020, 04/01/2020, 10/12/2020   Zoster Recombinant(Shingrix) 07/19/2019, 10/14/2019    Health Maintenance  Topic Date Due   Colonoscopy  12/13/2023   COVID-19 Vaccine (4 - 2023-24 season) 10/01/2023 (Originally 08/13/2023)   Pneumonia Vaccine 10+ Years old (1 of 1 - PCV) 09/14/2024 (Originally 08/26/2020)   MAMMOGRAM  10/06/2024   INFLUENZA VACCINE  Completed   DEXA SCAN  Completed   Hepatitis C Screening  Completed   Zoster Vaccines- Shingrix  Completed   HPV VACCINES  Aged Out   DTaP/Tdap/Td  Discontinued    Discussed health benefits of physical activity, and encouraged her to engage in regular exercise appropriate for her age and condition.  Marland KitchenToney Castaneda was seen today for annual exam.  Diagnoses and all orders for this visit:  Routine physical examination -     CBC w/Diff/Platelet -     CMP14+EGFR -     Lipid panel -     Vitamin D (25 hydroxy) -     TSH  Immunization due -     Flu Vaccine Trivalent High Dose (Fluad)  Screening for diabetes mellitus -      CMP14+EGFR  Screening for lipid disorders -     Lipid panel  Post-menopausal -     Vitamin D (25 hydroxy)   .[name] Discussed 150 minutes of exercise a week.  Encouraged vitamin D 1000 units and Calcium 1300mg  or 4 servings of dairy a day.  Fasting labs ordered Will get mammogram at work Pap not indicated Dexa scan UTD Colonoscopy scheduled for November  BP readings good at home Will continue to monitor at home Return in about 1 year (around 09/14/2024).     Tandy Gaw, PA-C

## 2023-09-15 NOTE — Patient Instructions (Addendum)
Health Maintenance After Age 68 After age 68, you are at a higher risk for certain long-term diseases and infections as well as injuries from falls. Falls are a major cause of broken bones and head injuries in people who are older than age 68. Getting regular preventive care can help to keep you healthy and well. Preventive care includes getting regular testing and making lifestyle changes as recommended by your health care provider. Talk with your health care provider about: Which screenings and tests you should have. A screening is a test that checks for a disease when you have no symptoms. A diet and exercise plan that is right for you. What should I know about screenings and tests to prevent falls? Screening and testing are the best ways to find a health problem early. Early diagnosis and treatment give you the best chance of managing medical conditions that are common after age 68. Certain conditions and lifestyle choices may make you more likely to have a fall. Your health care provider may recommend: Regular vision checks. Poor vision and conditions such as cataracts can make you more likely to have a fall. If you wear glasses, make sure to get your prescription updated if your vision changes. Medicine review. Work with your health care provider to regularly review all of the medicines you are taking, including over-the-counter medicines. Ask your health care provider about any side effects that may make you more likely to have a fall. Tell your health care provider if any medicines that you take make you feel dizzy or sleepy. Strength and balance checks. Your health care provider may recommend certain tests to check your strength and balance while standing, walking, or changing positions. Foot health exam. Foot pain and numbness, as well as not wearing proper footwear, can make you more likely to have a fall. Screenings, including: Osteoporosis screening. Osteoporosis is a condition that causes  the bones to get weaker and break more easily. Blood pressure screening. Blood pressure changes and medicines to control blood pressure can make you feel dizzy. Depression screening. You may be more likely to have a fall if you have a fear of falling, feel depressed, or feel unable to do activities that you used to do. Alcohol use screening. Using too much alcohol can affect your balance and may make you more likely to have a fall. Follow these instructions at home: Lifestyle Do not drink alcohol if: Your health care provider tells you not to drink. If you drink alcohol: Limit how much you have to: 0-1 drink a day for women. 0-2 drinks a day for men. Know how much alcohol is in your drink. In the [place] one drink equals one 12 oz bottle of beer (355 mL), one 5 oz glass of wine (148 mL), or one 1 oz glass of hard liquor (44 mL). Do not use any products that contain nicotine or tobacco. These products include cigarettes, chewing tobacco, and vaping devices, such as e-cigarettes. If you need help quitting, ask your health care provider. Activity  Follow a regular exercise program to stay fit. This will help you maintain your balance. Ask your health care provider what types of exercise are appropriate for you. If you need a cane or walker, use it as recommended by your health care provider. Wear supportive shoes that have nonskid soles. Safety  Remove any tripping hazards, such as rugs, cords, and clutter. Install safety equipment such as grab bars in bathrooms and safety rails on stairs. Keep rooms and walkways   well-lit. General instructions Talk with your health care provider about your risks for falling. Tell your health care provider if: You fall. Be sure to tell your health care provider about all falls, even ones that seem minor. You feel dizzy, tiredness (fatigue), or off-balance. Take over-the-counter and prescription medicines only as told by your health care provider. These include  supplements. Eat a healthy diet and maintain a healthy weight. A healthy diet includes low-fat dairy products, low-fat (lean) meats, and fiber from whole grains, beans, and lots of fruits and vegetables. Stay current with your vaccines. Schedule regular health, dental, and eye exams. Summary Having a healthy lifestyle and getting preventive care can help to protect your health and wellness after age 68. Screening and testing are the best way to find a health problem early and help you avoid having a fall. Early diagnosis and treatment give you the best chance for managing medical conditions that are more common for people who are older than age 68. Falls are a major cause of broken bones and head injuries in people who are older than age 68. Take precautions to prevent a fall at home. Work with your health care provider to learn what changes you can make to improve your health and wellness and to prevent falls. This information is not intended to replace advice given to you by your health care provider. Make sure you discuss any questions you have with your health care provider. Document Revised: 04/19/2021 Document Reviewed: 04/19/2021 Elsevier Patient Education  2024 [employer] Inc.  

## 2023-09-18 ENCOUNTER — Other Ambulatory Visit: Payer: Self-pay | Admitting: Physician Assistant

## 2023-09-18 NOTE — Progress Notes (Signed)
Rolando,   Vitamin D still pending.  Kidney, liver, glucose look great.  Thyroid normal range.  LDL not to optimal goal.  HDL, good cholesterol, looks fantastic.   Labs look really good.

## 2023-09-19 LAB — LIPID PANEL
Chol/HDL Ratio: 2 {ratio} (ref 0.0–4.4)
Cholesterol, Total: 240 mg/dL — ABNORMAL HIGH (ref 100–199)
HDL: 118 mg/dL (ref >39)
LDL Chol Calc (NIH): 113 mg/dL — ABNORMAL HIGH (ref 0–99)
Triglycerides: 55 mg/dL (ref 0–149)
VLDL Cholesterol Cal: 9 mg/dL (ref 5–40)

## 2023-09-19 LAB — CBC WITH DIFFERENTIAL/PLATELET
Basophils Absolute: 0 x10E3/uL (ref 0.0–0.2)
Basos: 1 %
EOS (ABSOLUTE): 0.1 x10E3/uL (ref 0.0–0.4)
Eos: 1 %
Hematocrit: 44.8 % (ref 34.0–46.6)
Hemoglobin: 13.7 g/dL (ref 11.1–15.9)
Immature Grans (Abs): 0 x10E3/uL (ref 0.0–0.1)
Immature Granulocytes: 0 %
Lymphocytes Absolute: 1.3 x10E3/uL (ref 0.7–3.1)
Lymphs: 34 %
MCH: 27.7 pg (ref 26.6–33.0)
MCHC: 30.6 g/dL — ABNORMAL LOW (ref 31.5–35.7)
MCV: 91 fL (ref 79–97)
Monocytes Absolute: 0.4 x10E3/uL (ref 0.1–0.9)
Monocytes: 10 %
Neutrophils Absolute: 2 x10E3/uL (ref 1.4–7.0)
Neutrophils: 54 %
Platelets: 237 x10E3/uL (ref 150–450)
RBC: 4.94 x10E6/uL (ref 3.77–5.28)
RDW: 12.9 % (ref 11.7–15.4)
WBC: 3.7 x10E3/uL (ref 3.4–10.8)

## 2023-09-19 LAB — CMP14+EGFR
ALT: 19 [IU]/L (ref 0–32)
AST: 29 [IU]/L (ref 0–40)
Albumin: 4.6 g/dL (ref 3.9–4.9)
Alkaline Phosphatase: 57 [IU]/L (ref 44–121)
BUN/Creatinine Ratio: 15 (ref 12–28)
BUN: 13 mg/dL (ref 8–27)
Bilirubin Total: 0.8 mg/dL (ref 0.0–1.2)
CO2: 22 mmol/L (ref 20–29)
Calcium: 9.6 mg/dL (ref 8.7–10.3)
Chloride: 103 mmol/L (ref 96–106)
Creatinine, Ser: 0.87 mg/dL (ref 0.57–1.00)
Globulin, Total: 2.8 g/dL (ref 1.5–4.5)
Glucose: 86 mg/dL (ref 70–99)
Potassium: 4.8 mmol/L (ref 3.5–5.2)
Sodium: 141 mmol/L (ref 134–144)
Total Protein: 7.4 g/dL (ref 6.0–8.5)
eGFR: 73 mL/min/{1.73_m2} (ref >59)

## 2023-09-19 LAB — VITAMIN D 25 HYDROXY (VIT D DEFICIENCY, FRACTURES): Vit D, 25-Hydroxy: 38.8 ng/mL (ref 30.0–100.0)

## 2023-09-19 LAB — TSH: TSH: 0.907 u[IU]/mL (ref 0.450–4.500)

## 2023-09-19 NOTE — Progress Notes (Signed)
Vitamin D normal but could be a little better. Double whatever you are taking and make sure taking with dairy.

## 2023-10-02 ENCOUNTER — Other Ambulatory Visit: Payer: Self-pay | Admitting: Physician Assistant

## 2023-10-02 DIAGNOSIS — Z1231 Encounter for screening mammogram for malignant neoplasm of breast: Secondary | ICD-10-CM

## 2023-11-15 ENCOUNTER — Ambulatory Visit: Payer: 59

## 2023-11-15 DIAGNOSIS — Z1231 Encounter for screening mammogram for malignant neoplasm of breast: Secondary | ICD-10-CM

## 2023-11-17 NOTE — Progress Notes (Signed)
Normal mammogram. Follow up in 1 year.

## 2024-09-16 ENCOUNTER — Encounter: Payer: Self-pay | Admitting: Physician Assistant

## 2024-09-16 ENCOUNTER — Ambulatory Visit: Payer: 59 | Admitting: Physician Assistant

## 2024-09-16 VITALS — BP 128/82 | HR 66 | Resp 18 | Ht 64.0 in | Wt 148.0 lb

## 2024-09-16 DIAGNOSIS — Z23 Encounter for immunization: Secondary | ICD-10-CM | POA: Diagnosis not present

## 2024-09-16 DIAGNOSIS — Z0000 Encounter for general adult medical examination without abnormal findings: Secondary | ICD-10-CM

## 2024-09-16 DIAGNOSIS — D72819 Decreased white blood cell count, unspecified: Secondary | ICD-10-CM

## 2024-09-16 DIAGNOSIS — M810 Age-related osteoporosis without current pathological fracture: Secondary | ICD-10-CM | POA: Diagnosis not present

## 2024-09-16 DIAGNOSIS — Z1211 Encounter for screening for malignant neoplasm of colon: Secondary | ICD-10-CM

## 2024-09-16 DIAGNOSIS — Z131 Encounter for screening for diabetes mellitus: Secondary | ICD-10-CM

## 2024-09-16 DIAGNOSIS — Z13220 Encounter for screening for lipoid disorders: Secondary | ICD-10-CM

## 2024-09-16 NOTE — Patient Instructions (Signed)
 Health Maintenance After Age 69 After age 27, you are at a higher risk for certain long-term diseases and infections as well as injuries from falls. Falls are a major cause of broken bones and head injuries in people who are older than age 73. Getting regular preventive care can help to keep you healthy and well. Preventive care includes getting regular testing and making lifestyle changes as recommended by your health care provider. Talk with your health care provider about: Which screenings and tests you should have. A screening is a test that checks for a disease when you have no symptoms. A diet and exercise plan that is right for you. What should I know about screenings and tests to prevent falls? Screening and testing are the best ways to find a health problem early. Early diagnosis and treatment give you the best chance of managing medical conditions that are common after age 90. Certain conditions and lifestyle choices may make you more likely to have a fall. Your health care provider may recommend: Regular vision checks. Poor vision and conditions such as cataracts can make you more likely to have a fall. If you wear glasses, make sure to get your prescription updated if your vision changes. Medicine review. Work with your health care provider to regularly review all of the medicines you are taking, including over-the-counter medicines. Ask your health care provider about any side effects that may make you more likely to have a fall. Tell your health care provider if any medicines that you take make you feel dizzy or sleepy. Strength and balance checks. Your health care provider may recommend certain tests to check your strength and balance while standing, walking, or changing positions. Foot health exam. Foot pain and numbness, as well as not wearing proper footwear, can make you more likely to have a fall. Screenings, including: Osteoporosis screening. Osteoporosis is a condition that causes  the bones to get weaker and break more easily. Blood pressure screening. Blood pressure changes and medicines to control blood pressure can make you feel dizzy. Depression screening. You may be more likely to have a fall if you have a fear of falling, feel depressed, or feel unable to do activities that you used to do. Alcohol  use screening. Using too much alcohol  can affect your balance and may make you more likely to have a fall. Follow these instructions at home: Lifestyle Do not drink alcohol  if: Your health care provider tells you not to drink. If you drink alcohol : Limit how much you have to: 0-1 drink a day for women. 0-2 drinks a day for men. Know how much alcohol  is in your drink. In the [place] one drink equals one 12 oz bottle of beer (355 mL), one 5 oz glass of wine (148 mL), or one 1 oz glass of hard liquor (44 mL). Do not use any products that contain nicotine or tobacco. These products include cigarettes, chewing tobacco, and vaping devices, such as e-cigarettes. If you need help quitting, ask your health care provider. Activity  Follow a regular exercise program to stay fit. This will help you maintain your balance. Ask your health care provider what types of exercise are appropriate for you. If you need a cane or walker, use it as recommended by your health care provider. Wear supportive shoes that have nonskid soles. Safety  Remove any tripping hazards, such as rugs, cords, and clutter. Install safety equipment such as grab bars in bathrooms and safety rails on stairs. Keep rooms and walkways  well-lit. General instructions Talk with your health care provider about your risks for falling. Tell your health care provider if: You fall. Be sure to tell your health care provider about all falls, even ones that seem minor. You feel dizzy, tiredness (fatigue), or off-balance. Take over-the-counter and prescription medicines only as told by your health care provider. These include  supplements. Eat a healthy diet and maintain a healthy weight. A healthy diet includes low-fat dairy products, low-fat (lean) meats, and fiber from whole grains, beans, and lots of fruits and vegetables. Stay current with your vaccines. Schedule regular health, dental, and eye exams. Summary Having a healthy lifestyle and getting preventive care can help to protect your health and wellness after age 15. Screening and testing are the best way to find a health problem early and help you avoid having a fall. Early diagnosis and treatment give you the best chance for managing medical conditions that are more common for people who are older than age 42. Falls are a major cause of broken bones and head injuries in people who are older than age 64. Take precautions to prevent a fall at home. Work with your health care provider to learn what changes you can make to improve your health and wellness and to prevent falls. This information is not intended to replace advice given to you by your health care provider. Make sure you discuss any questions you have with your health care provider. Document Revised: 04/19/2021 Document Reviewed: 04/19/2021 Elsevier Patient Education  2024 [employer].

## 2024-09-16 NOTE — Progress Notes (Unsigned)
   Complete physical exam  Patient: Casey Castaneda   DOB: [birth year]   68 y.o. Female  MRN: [id number]  Subjective:    No chief complaint on file.   Casey Castaneda is a 68 y.o. female who presents today for a complete physical exam. She reports consuming a {diet types:17450} diet. {types:19826} She generally feels {DESC; WELL/FAIRLY WELL/POORLY:18703}. She reports sleeping {DESC; WELL/FAIRLY WELL/POORLY:18703}. She {does/does not:200015} have additional problems to discuss today.    Most recent fall risk assessment:    07/27/2023   10:49 AM  Fall Risk   Falls in the past year? 0  Number falls in past yr: 0  Injury with Fall? 0  Risk for fall due to : No Fall Risks  Follow up Falls evaluation completed     Most recent depression screenings:    07/27/2023   10:49 AM 09/12/2022    9:50 AM  PHQ 2/9 Scores  PHQ - 2 Score 0 0  PHQ- 9 Score  0    {VISON DENTAL STD PSA (Optional):27386}  {History (Optional):23778}  Patient Care Team: Gaetan Spieker L, PA-C as PCP - General (Family Medicine)   Outpatient Medications Prior to Visit  Medication Sig   azelastine  (ASTELIN ) 0.1 % nasal spray Place 2 sprays into both nostrils 2 (two) times daily. Use in each nostril as directed   calcium citrate-vitamin D  (CITRACAL+D) 315-200 MG-UNIT tablet Take 1 tablet by mouth 2 (two) times daily.   famotidine  (PEPCID ) 20 MG tablet Take 1 tablet (20 mg total) by mouth 2 (two) times daily.   Multiple Vitamin (MULTIVITAMIN) capsule Take by mouth.   Multiple Vitamin (MULTIVITAMIN) capsule Take 1 capsule by mouth daily.   No facility-administered medications prior to visit.    ROS        Objective:     There were no vitals taken for this visit. {Vitals History (Optional):23777}  Physical Exam   No results found for any visits on 09/16/24. {Show previous labs (optional):23779}    Assessment & Plan:    Routine Health Maintenance and Physical Exam  Immunization History  Administered  Date(s) Administered   Fluad Quad(high Dose 65+) 09/12/2022   Fluad Trivalent(High Dose 65+) 09/15/2023   Influenza,inj,Quad PF,6+ Mos 09/03/2019   Influenza-Unspecified 09/15/2021   PFIZER(Purple Top)SARS-COV-2 Vaccination 03/12/2020, 04/01/2020, 10/12/2020   Zoster Recombinant(Shingrix ) 07/19/2019, 10/14/2019    Health Maintenance  Topic Date Due   Pneumococcal Vaccine: 50+ Years (1 of 1 - PCV) Never done   Colonoscopy  12/13/2023   Influenza Vaccine  07/12/2024   COVID-19 Vaccine (4 - 2025-26 season) 08/12/2024   Mammogram  11/14/2025   DEXA SCAN  Completed   Hepatitis C Screening  Completed   Zoster Vaccines- Shingrix   Completed   Meningococcal B Vaccine  Aged Out   DTaP/Tdap/Td  Discontinued    Discussed health benefits of physical activity, and encouraged her to engage in regular exercise appropriate for her age and condition.  Problem List Items Addressed This Visit   None Visit Diagnoses       Routine physical examination    -  Primary      No follow-ups on file.     Casey Rebello, PA-C

## 2024-09-17 ENCOUNTER — Ambulatory Visit: Payer: Self-pay | Admitting: Physician Assistant

## 2024-09-17 ENCOUNTER — Encounter: Payer: Self-pay | Admitting: Physician Assistant

## 2024-09-17 LAB — CBC WITH DIFFERENTIAL/PLATELET
Basophils Absolute: 0 x10E3/uL (ref 0.0–0.2)
Basos: 1 %
EOS (ABSOLUTE): 0 x10E3/uL (ref 0.0–0.4)
Eos: 1 %
Hematocrit: 42.2 % (ref 34.0–46.6)
Hemoglobin: 13.1 g/dL (ref 11.1–15.9)
Immature Grans (Abs): 0 x10E3/uL (ref 0.0–0.1)
Immature Granulocytes: 0 %
Lymphocytes Absolute: 1.1 x10E3/uL (ref 0.7–3.1)
Lymphs: 33 %
MCH: 28.9 pg (ref 26.6–33.0)
MCHC: 31 g/dL — ABNORMAL LOW (ref 31.5–35.7)
MCV: 93 fL (ref 79–97)
Monocytes Absolute: 0.3 x10E3/uL (ref 0.1–0.9)
Monocytes: 9 %
Neutrophils Absolute: 2 x10E3/uL (ref 1.4–7.0)
Neutrophils: 56 %
Platelets: 209 x10E3/uL (ref 150–450)
RBC: 4.54 x10E6/uL (ref 3.77–5.28)
RDW: 13 % (ref 11.7–15.4)
WBC: 3.4 x10E3/uL (ref 3.4–10.8)

## 2024-09-17 LAB — CMP14+EGFR
ALT: 17 IU/L (ref 0–32)
AST: 29 IU/L (ref 0–40)
Albumin: 4.8 g/dL (ref 3.9–4.9)
Alkaline Phosphatase: 56 IU/L (ref 49–135)
BUN/Creatinine Ratio: 17 (ref 12–28)
BUN: 16 mg/dL (ref 8–27)
Bilirubin Total: 0.8 mg/dL (ref 0.0–1.2)
CO2: 22 mmol/L (ref 20–29)
Calcium: 9.7 mg/dL (ref 8.7–10.3)
Chloride: 101 mmol/L (ref 96–106)
Creatinine, Ser: 0.94 mg/dL (ref 0.57–1.00)
Globulin, Total: 2.7 g/dL (ref 1.5–4.5)
Glucose: 99 mg/dL (ref 70–99)
Potassium: 4.9 mmol/L (ref 3.5–5.2)
Sodium: 139 mmol/L (ref 134–144)
Total Protein: 7.5 g/dL (ref 6.0–8.5)
eGFR: 66 mL/min/1.73 (ref >59)

## 2024-09-17 LAB — LIPID PANEL
Chol/HDL Ratio: 1.7 ratio (ref 0.0–4.4)
Cholesterol, Total: 246 mg/dL — ABNORMAL HIGH (ref 100–199)
HDL: 141 mg/dL (ref >39)
LDL Chol Calc (NIH): 99 mg/dL (ref 0–99)
Triglycerides: 33 mg/dL (ref 0–149)
VLDL Cholesterol Cal: 6 mg/dL (ref 5–40)

## 2024-09-17 LAB — TSH: TSH: 1.03 u[IU]/mL (ref 0.450–4.500)

## 2024-09-17 LAB — VITAMIN D 25 HYDROXY (VIT D DEFICIENCY, FRACTURES): Vit D, 25-Hydroxy: 64.8 ng/mL (ref 30.0–100.0)

## 2024-09-17 NOTE — Progress Notes (Signed)
 Nera,   LDL under 100 GREAT job.  Thyroid normal.  Kidney, liver, glucose looks good.  Vitamin D  looks amazing.

## 2024-12-20 ENCOUNTER — Ambulatory Visit: Admitting: Family Medicine

## 2024-12-20 ENCOUNTER — Encounter: Payer: Self-pay | Admitting: Family Medicine

## 2024-12-20 VITALS — BP 144/80 | HR 68 | Temp 98.4°F | Ht 64.0 in | Wt 151.0 lb

## 2024-12-20 DIAGNOSIS — J069 Acute upper respiratory infection, unspecified: Secondary | ICD-10-CM | POA: Diagnosis not present

## 2024-12-20 MED ORDER — BENZONATATE 200 MG PO CAPS: 200.0000 mg | ORAL_CAPSULE | Freq: Two times a day (BID) | ORAL | 0 refills | Status: AC | PRN

## 2024-12-20 NOTE — Progress Notes (Signed)
 "  Acute Office Visit  Subjective:  Patient ID: Casey Castaneda, female    DOB: 03/08/1956  Age: 69 y.o. MRN: [id number]  CC:  Chief Complaint  Patient presents with   Cough   Generalized Body Aches      HPI Casey Castaneda is here for upper respiratory symptoms.   Discussed the use of AI scribe software for clinical note transcription with the patient, who gave verbal consent to proceed.  History of Present Illness Casey Castaneda is a 69 year old female who presents with upper respiratory symptoms for three days.  She has been experiencing upper respiratory symptoms for the past three days, starting with a sore throat, followed by the development of yellow mucus and a cough that feels like it is moving into her chest. The cough is more productive during the day but dry at night, disrupting her sleep.  She also has a runny nose, congestion, and a sensation of ear pressure without pain. No fever is present, but she has body aches. She has been using Mucinex for chest congestion, which has helped loosen the mucus, making her cough more productive during the day.  Her husband has been experiencing similar symptoms for over a week, primarily affecting his sinuses. Neither she nor her husband have been tested for flu or COVID-19.  She is currently using Astelin  nose spray for allergies and drainage, and she has Flonase available. She continues to use Mucinex twice a day to manage her symptoms. No fever or trouble breathing.           Past Medical History:  Diagnosis Date   Abnormal Pap smear of vagina    Neck pain    Shoulder pain     Past Surgical History:  Procedure Laterality Date   CESAREAN SECTION      Family History  Problem Relation Age of Onset   Leukemia Mother    Hypertension Father     Social History   Socioeconomic History   Marital status: Married    Spouse name: Not on file   Number of children: Not on file   Years of education: Not on file   Highest  education level: Bachelor's degree (e.g., BA, AB, BS)  Occupational History   Not on file  Tobacco Use   Smoking status: Former   Smokeless tobacco: Never  Vaping Use   Vaping status: Never Used  Substance and Sexual Activity   Alcohol use: Yes    Comment: red wine 4 q wk   Drug use: No   Sexual activity: Not on file  Other Topics Concern   Not on file  Social History Narrative   Not on file   Social Drivers of Health   Tobacco Use: Medium Risk (12/20/2024)   Patient History    Smoking Tobacco Use: Former    Smokeless Tobacco Use: Never    Passive Exposure: Not on file  Financial Resource Strain: Low Risk (12/19/2024)   Overall Financial Resource Strain (CARDIA)    Difficulty of Paying Living Expenses: Not very hard  Food Insecurity: No Food Insecurity (12/19/2024)   Epic    Worried About Radiation Protection Practitioner of Food in the Last Year: Never true    Ran Out of Food in the Last Year: Never true  Transportation Needs: No Transportation Needs (12/19/2024)   Epic    Lack of Transportation (Medical): No    Lack of Transportation (Non-Medical): No  Physical Activity: Sufficiently Active (12/19/2024)   Exercise Vital Sign  Days of Exercise per Week: 4 days    Minutes of Exercise per Session: 40 min  Stress: No Stress Concern Present (12/19/2024)   [employer] of Occupational Health - Occupational Stress Questionnaire    Feeling of Stress: Only a little  Social Connections: Unknown (12/19/2024)   Social Connection and Isolation Panel    Frequency of Communication with Friends and Family: Once a week    Frequency of Social Gatherings with Friends and Family: Patient declined    Attends Religious Services: More than 4 times per year    Active Member of Clubs or Organizations: Yes    Attends Banker Meetings: 1 to 4 times per year    Marital Status: Married  Catering Manager Violence: Not on file  Depression (PHQ2-9): Low Risk (09/17/2024)   Depression (PHQ2-9)    PHQ-2 Score:  0  Alcohol Screen: Low Risk (12/19/2024)   Alcohol Screen    Last Alcohol Screening Score (AUDIT): 3  Housing: Low Risk (12/19/2024)   Epic    Unable to Pay for Housing in the Last Year: No    Number of Times Moved in the Last Year: 1    Homeless in the Last Year: No  Utilities: Not on file  Health Literacy: Not on file    ROS All ROS negative except what is listed in the HPI.   Objective:   Today's Vitals: BP (!) 144/80   Pulse 68   Temp 98.4 F (36.9 C) (Oral)   Ht 5' 4 (1.626 m)   Wt 151 lb (68.5 kg)   SpO2 100%   BMI 25.92 kg/m   Physical Exam Vitals reviewed.  Constitutional:      General: She is not in acute distress.    Appearance: Normal appearance.  HENT:     Head: Normocephalic and atraumatic.     Ears:     Comments: Both canals partially occluded with cerumen, but visible portion of TMs are normal    Nose: Congestion present.     Mouth/Throat:     Pharynx: No oropharyngeal exudate or posterior oropharyngeal erythema.  Eyes:     Conjunctiva/sclera: Conjunctivae normal.  Cardiovascular:     Rate and Rhythm: Normal rate and regular rhythm.     Heart sounds: Normal heart sounds.  Pulmonary:     Effort: Pulmonary effort is normal.     Breath sounds: Normal breath sounds.  Lymphadenopathy:     Cervical: No cervical adenopathy.  Skin:    General: Skin is warm and dry.  Neurological:     Mental Status: She is alert and oriented to person, place, and time.  Psychiatric:        Mood and Affect: Mood normal.        Behavior: Behavior normal.        Thought Content: Thought content normal.        Judgment: Judgment normal.         Assessment & Plan:   Problem List Items Addressed This Visit   None Visit Diagnoses       Viral URI with cough    -  Primary   Relevant Medications   benzonatate  (TESSALON ) 200 MG capsule        Assessment & Plan Acute upper respiratory infection Symptoms suggest viral etiology. No alarm findings on exam. -  Continue Mucinex twice daily. - Prescribed Tessalon  twice daily for cough. - Use Flonase or Nasacort  daily - Encourage hydration and rest. - Monitor symptoms; message  if no improvement after the weekend to consider additional treatment. - Consider home COVID-19 and flu testing; currently out of stock in our office. - Continue supportive measures including rest, hydration, humidifier use, steam showers, warm compresses to sinuses, warm liquids with lemon and honey, and over-the-counter cough, cold, and analgesics as needed.   Patient aware of signs/symptoms requiring further/urgent evaluation.          Follow-up: Return if symptoms worsen or fail to improve.   Waddell FURY Almarie, DNP, FNP-C  I,Emily Lagle,acting as a neurosurgeon for Waddell KATHEE Almarie, NP.,have documented all relevant documentation on the behalf of Waddell KATHEE Almarie, NP.   I, Waddell KATHEE Almarie, NP, have reviewed all documentation for this visit. The documentation on 12/20/2024 for the exam, diagnosis, procedures, and orders are all accurate and complete. "

## 2025-09-16 ENCOUNTER — Encounter: Admitting: Physician Assistant
